# Patient Record
Sex: Female | Born: 1960 | Race: Black or African American | Hispanic: No | Marital: Married | Smoking: Former smoker
Health system: Southern US, Community
[De-identification: ages and names within clinical notes are randomized; demographics above are authoritative.]

## PROBLEM LIST (undated history)

## (undated) DIAGNOSIS — J449 Chronic obstructive pulmonary disease, unspecified: Secondary | ICD-10-CM

## (undated) DIAGNOSIS — T4145XA Adverse effect of unspecified anesthetic, initial encounter: Secondary | ICD-10-CM

## (undated) DIAGNOSIS — F419 Anxiety disorder, unspecified: Secondary | ICD-10-CM

## (undated) DIAGNOSIS — T8859XA Other complications of anesthesia, initial encounter: Secondary | ICD-10-CM

## (undated) DIAGNOSIS — I10 Essential (primary) hypertension: Secondary | ICD-10-CM

## (undated) DIAGNOSIS — R911 Solitary pulmonary nodule: Secondary | ICD-10-CM

## (undated) DIAGNOSIS — K219 Gastro-esophageal reflux disease without esophagitis: Secondary | ICD-10-CM

## (undated) DIAGNOSIS — F32A Depression, unspecified: Secondary | ICD-10-CM

## (undated) DIAGNOSIS — J45909 Unspecified asthma, uncomplicated: Secondary | ICD-10-CM

## (undated) DIAGNOSIS — R011 Cardiac murmur, unspecified: Secondary | ICD-10-CM

## (undated) DIAGNOSIS — F329 Major depressive disorder, single episode, unspecified: Secondary | ICD-10-CM

## (undated) HISTORY — PX: TUBAL LIGATION: SHX77

---

## 2006-08-05 ENCOUNTER — Inpatient Hospital Stay (HOSPITAL_COMMUNITY): Admission: AD | Admit: 2006-08-05 | Discharge: 2006-08-05 | Payer: Self-pay | Admitting: Obstetrics and Gynecology

## 2006-09-17 ENCOUNTER — Other Ambulatory Visit: Admission: RE | Admit: 2006-09-17 | Discharge: 2006-09-17 | Payer: Self-pay | Admitting: Obstetrics and Gynecology

## 2006-09-17 ENCOUNTER — Ambulatory Visit: Payer: Self-pay | Admitting: Obstetrics and Gynecology

## 2006-09-17 ENCOUNTER — Encounter: Payer: Self-pay | Admitting: Obstetrics and Gynecology

## 2006-09-17 ENCOUNTER — Encounter (INDEPENDENT_AMBULATORY_CARE_PROVIDER_SITE_OTHER): Payer: Self-pay | Admitting: Specialist

## 2006-10-28 ENCOUNTER — Ambulatory Visit (HOSPITAL_COMMUNITY): Admission: RE | Admit: 2006-10-28 | Discharge: 2006-10-28 | Payer: Self-pay | Admitting: Obstetrics and Gynecology

## 2009-07-17 ENCOUNTER — Emergency Department (HOSPITAL_BASED_OUTPATIENT_CLINIC_OR_DEPARTMENT_OTHER): Admission: EM | Admit: 2009-07-17 | Discharge: 2009-07-17 | Payer: Self-pay | Admitting: Emergency Medicine

## 2009-07-17 ENCOUNTER — Ambulatory Visit: Payer: Self-pay | Admitting: Radiology

## 2009-09-11 ENCOUNTER — Encounter: Admission: RE | Admit: 2009-09-11 | Discharge: 2009-09-11 | Payer: Self-pay | Admitting: Infectious Diseases

## 2009-10-11 ENCOUNTER — Ambulatory Visit: Payer: Self-pay | Admitting: Obstetrics and Gynecology

## 2009-10-11 ENCOUNTER — Other Ambulatory Visit: Payer: Self-pay | Admitting: Obstetrics and Gynecology

## 2009-12-13 ENCOUNTER — Ambulatory Visit: Payer: Self-pay | Admitting: Obstetrics and Gynecology

## 2009-12-28 ENCOUNTER — Ambulatory Visit: Payer: Self-pay | Admitting: Obstetrics and Gynecology

## 2010-03-21 ENCOUNTER — Ambulatory Visit: Payer: Self-pay | Admitting: Obstetrics and Gynecology

## 2010-06-07 ENCOUNTER — Ambulatory Visit: Payer: Self-pay | Admitting: Obstetrics & Gynecology

## 2010-08-27 ENCOUNTER — Ambulatory Visit: Payer: Self-pay | Admitting: Obstetrics & Gynecology

## 2010-08-30 ENCOUNTER — Encounter: Admission: RE | Admit: 2010-08-30 | Discharge: 2010-08-30 | Payer: Self-pay | Admitting: Emergency Medicine

## 2010-11-12 ENCOUNTER — Ambulatory Visit (INDEPENDENT_AMBULATORY_CARE_PROVIDER_SITE_OTHER): Payer: PRIVATE HEALTH INSURANCE

## 2010-11-12 DIAGNOSIS — N920 Excessive and frequent menstruation with regular cycle: Secondary | ICD-10-CM

## 2010-11-12 DIAGNOSIS — D259 Leiomyoma of uterus, unspecified: Secondary | ICD-10-CM

## 2011-01-03 LAB — URINE MICROSCOPIC-ADD ON

## 2011-01-03 LAB — URINALYSIS, ROUTINE W REFLEX MICROSCOPIC
Glucose, UA: NEGATIVE mg/dL
Protein, ur: NEGATIVE mg/dL
Specific Gravity, Urine: 1.016 (ref 1.005–1.030)
pH: 6 (ref 5.0–8.0)

## 2011-01-30 ENCOUNTER — Ambulatory Visit (INDEPENDENT_AMBULATORY_CARE_PROVIDER_SITE_OTHER): Payer: PRIVATE HEALTH INSURANCE

## 2011-01-30 DIAGNOSIS — N920 Excessive and frequent menstruation with regular cycle: Secondary | ICD-10-CM

## 2011-02-15 NOTE — Group Therapy Note (Signed)
Melissa Molina, Melissa Molina               ACCOUNT NO.:  [id number]   MEDICAL RECORD NO.:  [id number]          PATIENT TYPE:  WOC   LOCATION:  [facility]                   FACILITY:  [facility]   PHYSICIAN:  Argentina Donovan, MD        DATE OF BIRTH:  [birth year]   DATE OF SERVICE:  09/17/2006                                  CLINIC NOTE   The patient is a 50 year old gravida 2, para 2-0-0-2, 2 vaginal  deliveries who had a postpartum tubal ligation. Youngest child is 66  years old, has not seen a doctor 20 years with the exception of visit to  the [facility] because of dysfunctional uterine bleeding 1 month ago.  Her  hemoglobin was slightly low. She has not had any significant bleeding  since that time, has never had a mammogram, and has not had a Pap smear  and 20 years   EXAMINATION:  BREASTS:  Symmetrical with no dominant masses.  Slight  nipple discharge on the right.  ABDOMEN:  Soft, nontender.  No masses or organomegaly.  EXTERNAL GENITALIA:  Normal.  BUS is within normal limits.  Vagina is  clean and well rugated.  Cervix clean and parous.  Uterus anterior, of  normal size, shape, consistency.  Adnexa could not be well palpated.  Pap smear and endometrial biopsy were taken.  The uterus was sounded to  a depth of 7 cm. The patient is going to be referred for mammogram. Will  have the patient receive a phone call with the results of the  endometrial biopsy.  She has a positive whiff test is is being put on  metronidazole 500 b.i.d. for 7 days.   IMPRESSION:  Perimenopausal bleeding and bacterial vaginosis.  The  patient has been instructed if the endometrial biopsy is okay that it is  not necessary for her to return unless the heavy bleeding restarts. She  is going to be iron therapy, and if that does restart she is to come in  and we will talk about possibility of endometrial ablation.           ______________________________  Argentina Donovan, MD     PR/MEDQ  D:  09/17/2006  T:  09/17/2006  Job:   [id number]

## 2012-08-17 ENCOUNTER — Emergency Department (HOSPITAL_BASED_OUTPATIENT_CLINIC_OR_DEPARTMENT_OTHER)
Admission: EM | Admit: 2012-08-17 | Discharge: 2012-08-17 | Disposition: A | Discharge status: Home / Self Care | Payer: Worker's Compensation | Attending: Emergency Medicine | Admitting: Emergency Medicine

## 2012-08-17 ENCOUNTER — Encounter (HOSPITAL_BASED_OUTPATIENT_CLINIC_OR_DEPARTMENT_OTHER): Payer: Self-pay

## 2012-08-17 DIAGNOSIS — X19XXXA Contact with other heat and hot substances, initial encounter: Secondary | ICD-10-CM | POA: Insufficient documentation

## 2012-08-17 DIAGNOSIS — Y9269 Other specified industrial and construction area as the place of occurrence of the external cause: Secondary | ICD-10-CM | POA: Insufficient documentation

## 2012-08-17 DIAGNOSIS — T23209A Burn of second degree of unspecified hand, unspecified site, initial encounter: Secondary | ICD-10-CM | POA: Insufficient documentation

## 2012-08-17 DIAGNOSIS — Y990 Civilian activity done for income or pay: Secondary | ICD-10-CM | POA: Insufficient documentation

## 2012-08-17 DIAGNOSIS — Z8719 Personal history of other diseases of the digestive system: Secondary | ICD-10-CM | POA: Insufficient documentation

## 2012-08-17 DIAGNOSIS — F17200 Nicotine dependence, unspecified, uncomplicated: Secondary | ICD-10-CM | POA: Insufficient documentation

## 2012-08-17 DIAGNOSIS — Z23 Encounter for immunization: Secondary | ICD-10-CM | POA: Insufficient documentation

## 2012-08-17 DIAGNOSIS — T23002A Burn of unspecified degree of left hand, unspecified site, initial encounter: Secondary | ICD-10-CM

## 2012-08-17 DIAGNOSIS — I10 Essential (primary) hypertension: Secondary | ICD-10-CM | POA: Insufficient documentation

## 2012-08-17 HISTORY — DX: Essential (primary) hypertension: I10

## 2012-08-17 HISTORY — DX: Gastro-esophageal reflux disease without esophagitis: K21.9

## 2012-08-17 MED ORDER — TETANUS-DIPHTH-ACELL PERTUSSIS 5-2.5-18.5 LF-MCG/0.5 IM SUSP
0.5000 mL | Freq: Once | INTRAMUSCULAR | Status: AC
  Administered 2012-08-17: 0.5 mL via INTRAMUSCULAR
  Filled 2012-08-17: qty 0.5

## 2012-08-17 MED ORDER — BACITRACIN ZINC 500 UNIT/GM EX OINT
TOPICAL_OINTMENT | Freq: Two times a day (BID) | CUTANEOUS | Status: DC
  Filled 2012-08-17: qty 15

## 2012-08-17 MED ORDER — SILVER SULFADIAZINE 1 % EX CREA
TOPICAL_CREAM | Freq: Once | CUTANEOUS | Status: AC
  Administered 2012-08-17: 13:00:00 via TOPICAL

## 2012-08-17 MED ORDER — SILVER SULFADIAZINE 1 % EX CREA
TOPICAL_CREAM | CUTANEOUS | Status: AC
  Filled 2012-08-17: qty 85

## 2012-08-17 MED ORDER — OXYCODONE-ACETAMINOPHEN 5-325 MG PO TABS
2.0000 | ORAL_TABLET | Freq: Once | ORAL | Status: AC
  Administered 2012-08-17: 2 via ORAL
  Filled 2012-08-17 (×2): qty 2

## 2012-08-17 MED ORDER — OXYCODONE-ACETAMINOPHEN 5-325 MG PO TABS: 2.0000 | ORAL_TABLET | ORAL | Status: DC | PRN

## 2012-08-17 NOTE — ED Provider Notes (Signed)
History     CSN: [id number]  Arrival date & time 08/17/12  1036   First MD Initiated Contact with Patient 08/17/12 1143      Chief Complaint  Patient presents with  . 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 No other injury complains of pain at the index middle and ring fingers. No other injury. No treatment prior to coming here Past Medical History  Diagnosis Date  . Hypertension   . GERD (gastroesophageal reflux disease)     History reviewed. No pertinent past surgical history.  No family history on file.  History  Substance Use Topics  . Smoking status: Current Every Day Smoker -- 1.5 packs/day  . Smokeless tobacco: Not on file  . Alcohol Use: No   marijuana use  OB History    Grav Para Term Preterm Abortions TAB SAB Ect Mult Living                  Review of Systems  Constitutional: Negative.   Skin: Positive for wound.  All other systems reviewed and are negative.    Allergies  Review of patient's allergies indicates no known allergies.  Home Medications   Current Outpatient Rx  Name  Route  Sig  Dispense  Refill  . ESOMEPRAZOLE MAGNESIUM 40 MG PO CPDR   Oral   Take 40 mg by mouth daily before breakfast.           BP 170/87  Pulse 87  Temp 98.7 F (37.1 C) (Oral)  Resp 18  Ht 5\' 6"  (1.676 m)  Wt 164 lb (74.39 kg)  BMI 26.47 kg/m2  SpO2 99%  Physical Exam  Constitutional: She is oriented to person, place, and time. She appears well-developed and well-nourished.  HENT:  Head: Normocephalic.  Eyes: EOM are normal.  Neck: Neck supple.  Cardiovascular: Normal rate.   Pulmonary/Chest: Effort normal.  Abdominal: She exhibits no distension.  Neurological: She is oriented to person, place, and time.  Skin:       Left upper extremity with minimally tender at  hip of the index finger no blister. Middle finger blistered at [facility] surface , distal and middle phalanges, ring finger blistered at distal phalanx. Limited flexion of middle finger due to pain    ED Course  Procedures (including critical care time)  Labs Reviewed - No data to display No results found.   No diagnosis found.    MDM  Spoke with Dr. Dawna Part at [facility], [facility]. Plan Silvadene dressings changed twice daily, prescription Percocet 5 Follow up in burnclinic 08/21/2012. Blood pressure recheck within 3 weeks Diagnoses #1 partial thickness burn to left hand #2 hypertension        Doug Sou, MD 08/17/12 1237

## 2012-08-17 NOTE — ED Notes (Signed)
Burn/Blisters to fingers on left hand.

## 2014-05-30 DIAGNOSIS — N200 Calculus of kidney: Secondary | ICD-10-CM | POA: Insufficient documentation

## 2015-12-21 ENCOUNTER — Encounter (HOSPITAL_BASED_OUTPATIENT_CLINIC_OR_DEPARTMENT_OTHER): Payer: Self-pay | Admitting: *Deleted

## 2015-12-21 ENCOUNTER — Emergency Department (HOSPITAL_BASED_OUTPATIENT_CLINIC_OR_DEPARTMENT_OTHER)
Admission: EM | Admit: 2015-12-21 | Discharge: 2015-12-21 | Disposition: A | Discharge status: Home / Self Care | Payer: BLUE CROSS/BLUE SHIELD | Attending: Emergency Medicine | Admitting: Emergency Medicine

## 2015-12-21 DIAGNOSIS — I10 Essential (primary) hypertension: Secondary | ICD-10-CM | POA: Diagnosis not present

## 2015-12-21 DIAGNOSIS — R6883 Chills (without fever): Secondary | ICD-10-CM | POA: Diagnosis not present

## 2015-12-21 DIAGNOSIS — F17200 Nicotine dependence, unspecified, uncomplicated: Secondary | ICD-10-CM | POA: Diagnosis not present

## 2015-12-21 DIAGNOSIS — T50905A Adverse effect of unspecified drugs, medicaments and biological substances, initial encounter: Secondary | ICD-10-CM

## 2015-12-21 DIAGNOSIS — T363X5A Adverse effect of macrolides, initial encounter: Secondary | ICD-10-CM | POA: Diagnosis not present

## 2015-12-21 DIAGNOSIS — T43225A Adverse effect of selective serotonin reuptake inhibitors, initial encounter: Secondary | ICD-10-CM | POA: Insufficient documentation

## 2015-12-21 DIAGNOSIS — Z9851 Tubal ligation status: Secondary | ICD-10-CM | POA: Insufficient documentation

## 2015-12-21 DIAGNOSIS — Z79899 Other long term (current) drug therapy: Secondary | ICD-10-CM | POA: Diagnosis not present

## 2015-12-21 DIAGNOSIS — K219 Gastro-esophageal reflux disease without esophagitis: Secondary | ICD-10-CM | POA: Diagnosis not present

## 2015-12-21 DIAGNOSIS — F419 Anxiety disorder, unspecified: Secondary | ICD-10-CM | POA: Insufficient documentation

## 2015-12-21 DIAGNOSIS — R109 Unspecified abdominal pain: Secondary | ICD-10-CM | POA: Insufficient documentation

## 2015-12-21 DIAGNOSIS — M79673 Pain in unspecified foot: Secondary | ICD-10-CM | POA: Insufficient documentation

## 2015-12-21 HISTORY — DX: Anxiety disorder, unspecified: F41.9

## 2015-12-21 NOTE — Discharge Instructions (Signed)
°  At this time, your symptoms are suspected to be due to your new medication, Celexa. When starting this medication, people may experience a feeling of jitteriness and anxiety. Contact your family doctor tomorrow to discuss the new symptoms he had developed since starting the medication. Listed below our general signs and symptoms to watch for in case your sensation of chills are the early symptom of an infection.  Precautions for Fever/chills, Adult A fever is an increase in the body's temperature. It is usually defined as a temperature of 100F (38C) or higher. Brief mild or moderate fevers generally have no long-term effects, and they often do not require treatment. Moderate or high fevers may make you feel uncomfortable and can sometimes be a sign of a serious illness or disease. The sweating that may occur with repeated or prolonged fever may also cause dehydration. Fever is confirmed by taking a temperature with a thermometer. A measured temperature can vary with:  Age.  Time of day.  Location of the thermometer:  Mouth (oral).  Rectum (rectal).  Ear (tympanic).  Underarm (axillary).  Forehead (temporal). HOME CARE INSTRUCTIONS Pay attention to any changes in your symptoms. Take these actions to help with your condition:  Take over-the counter and prescription medicines only as told by your health care provider. Follow the dosing instructions carefully.  If you were prescribed an antibiotic medicine, take it as told by your health care provider. Do not stop taking the antibiotic even if you start to feel better.  Rest as needed.  Drink enough fluid to keep your urine clear or pale yellow. This helps to prevent dehydration.  Sponge yourself or bathe with room-temperature water to help reduce your body temperature as needed. Do not use ice water.  Do not overbundle yourself in blankets or heavy clothes. SEEK MEDICAL CARE IF:  You vomit.  You cannot eat or drink without  vomiting.  You have diarrhea.  You have pain when you urinate.  Your symptoms do not improve with treatment.  You develop new symptoms.  You develop excessive weakness. SEEK IMMEDIATE MEDICAL CARE IF:  You have shortness of breath or have trouble breathing.  You are dizzy or you faint.  You are disoriented or confused.  You develop signs of dehydration, such as a dry mouth, decreased urination, or paleness.  You develop severe pain in your abdomen.  You have persistent vomiting or diarrhea.  You develop a skin rash.  Your symptoms suddenly get worse.   This information is not intended to replace advice given to you by your health care provider. Make sure you discuss any questions you have with your health care provider.   Document Released: 03/12/2001 Document Revised: 06/07/2015 Document Reviewed: 11/10/2014 Elsevier Interactive Patient Education [employer].

## 2015-12-21 NOTE — ED Provider Notes (Signed)
CSN: [id number]     Arrival date & time 12/21/15  1902 History  By signing my name below, I, Altamease Oiler, attest that this documentation has been prepared under the direction and in the presence of Charlesetta Shanks, MD. Electronically Signed: Altamease Oiler, ED Scribe. 12/21/2015. 9:58 PM    Chief Complaint  Patient presents with  . Chills   The history is provided by the patient. No language interpreter was used.   Melissa Molina is a 55 y.o. female with history of HTN and anxiety who presents to the Emergency Department complaining of chills with onset this afternoon. Pt states that she was in her usual state of health this morning and the chills started after she took Celexa and clarithromycin (for recurrent sores at the bottom of her feet), both of which are new for her.  Associated symptoms include "jittery" abdominal discomfort. Pt also complains of a productive cough.  Pt denies fever and vomiting. She reports she just doesn't feel like herself. 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 She reports her doctor started the clarithromycin because she thought the were infected.  Past Medical History  Diagnosis Date  . Hypertension   . GERD (gastroesophageal reflux disease)   . Anxiety    Past Surgical History  Procedure Laterality Date  . Tubal ligation     No family history on file. Social History  Substance Use Topics  . Smoking status: Current Every Day Smoker -- 1.50 packs/day  . Smokeless tobacco: None  . Alcohol Use: No   OB History    No data available     Review of Systems  Constitutional: Positive for chills.  Respiratory: Positive for cough.   Gastrointestinal:       Abdominal discomfort   Skin:       Sores at the bottom of the feet  All other systems reviewed and are negative.  Allergies  Review of patient's allergies indicates no known allergies.  Home Medications   Prior to Admission  medications   Medication Sig Start Date End Date Taking? Authorizing Provider  Citalopram Hydrobromide (CELEXA PO) Take by mouth.   Yes Historical Provider, MD  Clarithromycin (BIAXIN PO) Take by mouth.   Yes Historical Provider, MD  HYDROCHLOROTHIAZIDE PO Take by mouth.   Yes Historical Provider, MD  esomeprazole (NEXIUM) 40 MG capsule Take 40 mg by mouth daily before breakfast.    Historical Provider, MD  oxyCODONE-acetaminophen (PERCOCET/ROXICET) 5-325 MG per tablet Take 2 tablets by mouth every 4 (four) hours as needed for pain. 08/17/12   Orlie Dakin, MD   BP 184/114 mmHg  Pulse 88  Temp(Src) 98.4 F (36.9 C) (Oral)  Resp 20  Ht 5' 5.5" (1.664 m)  Wt 164 lb (74.39 kg)  BMI 26.87 kg/m2  SpO2 100% Physical Exam  Constitutional: She is oriented to person, place, and time. She appears well-developed and well-nourished. No distress.  HENT:  Head: Normocephalic and atraumatic.  Nose: Nose normal.  Mouth/Throat: Oropharynx is clear and moist.  Eyes: EOM are normal. Pupils are equal, round, and reactive to light.  Neck: Normal range of motion. Neck supple.  Cardiovascular: Normal rate and regular rhythm.   2/6 systolic ejection murmur.  Pulmonary/Chest: Effort normal and breath sounds normal. No respiratory distress. She has no wheezes. She has no rales.  Abdominal: Soft. She exhibits no distension. There is no tenderness.  Musculoskeletal: Normal range of motion. She exhibits no  edema or tenderness.  Neurological: She is alert and oriented to person, place, and time. No cranial nerve deficit. She exhibits normal muscle tone. Coordination normal.  Patient is up and ambulatory about the emergency department. Her gait is coordinated.  Skin: Skin is warm and dry.  The soles of the patient's feet have multiple plantar warts. These are flat and keritinous. she reports they're tender however she is able to stand and ambulate about the room without difficulty. There is no general edema of  the feet there is no erythema to suggest cellulitis.  Psychiatric: She has a normal mood and affect. Judgment normal.  Nursing note and vitals reviewed.   ED Course  Procedures (including critical care time) DIAGNOSTIC STUDIES: Oxygen Saturation is 100% on RA,  normal by my interpretation.    COORDINATION OF CARE: 9:47 PM Discussed treatment plan with pt at bedside and pt agreed to plan.  Labs Review Labs Reviewed - No data to display  Imaging Review No results found.   EKG Interpretation None      MDM   Final diagnoses:  Chills (without fever)  Adverse effects of medication, initial encounter   Patient presents with sensation of chills. At this time she does not appear acutely ill. He does not have fever. Along with the symptom of chills, the patient has a general sensation of jitteriness. I suspect symptoms are secondary to her recent initiation of Celexa. At this time, we reviewed the typical side effect profile of the initial phase of taking Celexa. The patient is contact her physician tomorrow to discuss the merits of continuing this medication until those symptoms abate versus trying a different medication if needed for anxiety or depression. Patient is counseled on signs and symptoms of early infection. Fever instructions are provided although at this time there is no fever or immediate sign of infectious illness.   Charlesetta Shanks, MD 12/21/15 2209

## 2015-12-21 NOTE — ED Notes (Signed)
Chills, fever, diarrhea and vomiting this am. She was seen by her MD 2 days ago and given Rx for anxiety. She started taking the medication this am.

## 2016-02-07 DIAGNOSIS — K219 Gastro-esophageal reflux disease without esophagitis: Secondary | ICD-10-CM | POA: Insufficient documentation

## 2016-02-07 DIAGNOSIS — I10 Essential (primary) hypertension: Secondary | ICD-10-CM | POA: Insufficient documentation

## 2016-02-29 DIAGNOSIS — L84 Corns and callosities: Secondary | ICD-10-CM | POA: Insufficient documentation

## 2016-10-07 ENCOUNTER — Emergency Department (HOSPITAL_BASED_OUTPATIENT_CLINIC_OR_DEPARTMENT_OTHER)
Admission: EM | Admit: 2016-10-07 | Discharge: 2016-10-07 | Disposition: A | Discharge status: Home / Self Care | Payer: BLUE CROSS/BLUE SHIELD | Attending: Emergency Medicine | Admitting: Emergency Medicine

## 2016-10-07 ENCOUNTER — Emergency Department (HOSPITAL_BASED_OUTPATIENT_CLINIC_OR_DEPARTMENT_OTHER): Payer: BLUE CROSS/BLUE SHIELD

## 2016-10-07 ENCOUNTER — Encounter (HOSPITAL_BASED_OUTPATIENT_CLINIC_OR_DEPARTMENT_OTHER): Payer: Self-pay | Admitting: *Deleted

## 2016-10-07 ENCOUNTER — Telehealth (HOSPITAL_BASED_OUTPATIENT_CLINIC_OR_DEPARTMENT_OTHER): Payer: Self-pay | Admitting: *Deleted

## 2016-10-07 DIAGNOSIS — F17210 Nicotine dependence, cigarettes, uncomplicated: Secondary | ICD-10-CM | POA: Diagnosis not present

## 2016-10-07 DIAGNOSIS — R630 Anorexia: Secondary | ICD-10-CM | POA: Insufficient documentation

## 2016-10-07 DIAGNOSIS — R0981 Nasal congestion: Secondary | ICD-10-CM | POA: Diagnosis not present

## 2016-10-07 DIAGNOSIS — R69 Illness, unspecified: Secondary | ICD-10-CM

## 2016-10-07 DIAGNOSIS — I10 Essential (primary) hypertension: Secondary | ICD-10-CM | POA: Insufficient documentation

## 2016-10-07 DIAGNOSIS — R5383 Other fatigue: Secondary | ICD-10-CM | POA: Insufficient documentation

## 2016-10-07 DIAGNOSIS — R51 Headache: Secondary | ICD-10-CM | POA: Insufficient documentation

## 2016-10-07 DIAGNOSIS — R509 Fever, unspecified: Secondary | ICD-10-CM | POA: Diagnosis not present

## 2016-10-07 DIAGNOSIS — R05 Cough: Secondary | ICD-10-CM | POA: Diagnosis not present

## 2016-10-07 DIAGNOSIS — J111 Influenza due to unidentified influenza virus with other respiratory manifestations: Secondary | ICD-10-CM

## 2016-10-07 LAB — INFLUENZA PANEL BY PCR (TYPE A & B)
Influenza A By PCR: POSITIVE — AB
Influenza B By PCR: NEGATIVE

## 2016-10-07 MED ORDER — OSELTAMIVIR PHOSPHATE 75 MG PO CAPS: 75.0000 mg | ORAL_CAPSULE | Freq: Two times a day (BID) | ORAL | 0 refills | Status: DC

## 2016-10-07 MED ORDER — ONDANSETRON 8 MG PO TBDP: 8.0000 mg | ORAL_TABLET | Freq: Three times a day (TID) | ORAL | 0 refills | Status: DC | PRN

## 2016-10-07 MED ORDER — ONDANSETRON 4 MG PO TBDP
4.0000 mg | ORAL_TABLET | Freq: Once | ORAL | Status: AC
  Administered 2016-10-07: 4 mg via ORAL
  Filled 2016-10-07: qty 1

## 2016-10-07 MED FILL — ONDANSETRON ODT 8 MG TABLET: 8 | 3 days supply | Qty: 8 | Fill #0

## 2016-10-07 NOTE — ED Provider Notes (Signed)
[place] [facility] Provider Note   CSN: [id number] Arrival date & time: 10/07/16  1043     History   Chief Complaint Chief Complaint  Patient presents with  . Cough    HPI Melissa Molina is a 56 y.o. female.  HPI Patient presents with cough chills and feeling bad all over. Began last night. Has had a cough with some clear sputum production. Did not check his temperature but feels that she has had fevers. No clear sick contacts. No nausea vomiting.some slight dizziness. Has a dull headache also. No hemoptysis. No dysuria. No diarrhea.no sore throat   Past Medical History:  Diagnosis Date  . Anxiety   . GERD (gastroesophageal reflux disease)   . Hypertension     There are no active problems to display for this patient.   Past Surgical History:  Procedure Laterality Date  . TUBAL LIGATION      OB History    No data available       Home Medications    Prior to Admission medications   Medication Sig Start Date End Date Taking? Authorizing Provider  ondansetron (ZOFRAN-ODT) 8 MG disintegrating tablet Take 1 tablet (8 mg total) by mouth every 8 (eight) hours as needed for nausea or vomiting. 10/07/16   Davonna Belling, MD  oseltamivir (TAMIFLU) 75 MG capsule Take 1 capsule (75 mg total) by mouth every 12 (twelve) hours. 10/07/16   Davonna Belling, MD    Family History No family history on file.  Social History Social History  Substance Use Topics  . Smoking status: Current Every Day Smoker    Packs/day: 0.50    Types: Cigarettes  . Smokeless tobacco: Never Used  . Alcohol use No     Allergies   Patient has no known allergies.   Review of Systems Review of Systems  Constitutional: Positive for appetite change, fatigue and fever.  HENT: Positive for congestion. Negative for sore throat.   Eyes: Negative for photophobia.  Respiratory: Positive for cough.   Cardiovascular: Negative for chest pain.  Gastrointestinal: Negative for abdominal  distention.  Endocrine: Negative for polyuria.  Genitourinary: Negative for dyspareunia and dysuria.  Musculoskeletal: Positive for myalgias.  Skin: Negative for rash.  Neurological: Positive for headaches.  Hematological: Negative for adenopathy.  Psychiatric/Behavioral: Negative for confusion.     Physical Exam Updated Vital Signs BP 152/84 (BP Location: Left Arm)   Pulse 90   Temp 100.3 F (37.9 C) (Oral)   Resp 16   Ht 5\' 6"  (1.676 m)   Wt 158 lb (71.7 kg)   SpO2 97%   BMI 25.50 kg/m   Physical Exam  Constitutional: She appears well-developed.  HENT:  Head: Atraumatic.  Eyes: EOM are normal.  Neck: Neck supple.  Cardiovascular: Normal rate.   Pulmonary/Chest: Effort normal. No respiratory distress. She has no wheezes.  Abdominal: Soft.  Musculoskeletal: She exhibits no edema.  Neurological: She is alert.  Skin: Skin is warm. Capillary refill takes less than 2 seconds.     ED Treatments / Results  Labs (all labs ordered are listed, but only abnormal results are displayed) Labs Reviewed  INFLUENZA PANEL BY PCR (TYPE A & B, H1N1)    EKG  EKG Interpretation None       Radiology Dg Chest 2 View  Result Date: 10/07/2016 CLINICAL DATA:  Cough with clear sputum, chills, body aches, dizziness, symptoms began last evening, history GERD, hypertension, smoking EXAM: CHEST  2 VIEW COMPARISON:  12/19/2015 FINDINGS: Normal heart size,  mediastinal contours, and pulmonary vascularity. Suspected BILATERAL nipple shadows. Lungs clear. No pleural effusion or pneumothorax. Question new nodular density at the RIGHT apex 12 mm diameter. Small bone island lateral LEFT second rib unchanged. Bones otherwise unremarkable. IMPRESSION: No acute infiltrate. Question new 12 mm diameter nodular density at RIGHT apex; noncontrast CT chest recommended for further assessment. Electronically Signed   By: Lavonia Dana M.D.   On: 10/07/2016 11:38    Procedures Procedures (including critical  care time)  Medications Ordered in ED Medications  ondansetron (ZOFRAN-ODT) disintegrating tablet 4 mg (4 mg Oral Given 10/07/16 1407)     Initial Impression / Assessment and Plan / ED Course  I have reviewed the triage vital signs and the nursing notes.  Pertinent labs & imaging results that were available during my care of the patient were reviewed by me and considered in my medical decision making (see chart for details).  Clinical Course     Patient presents with flulike illness. Sputum chills aches. X-ray does not show pneumonia. Flu test pending but given Zofran and Tamiflu for home. Will follow-up with her own flu test results. There was a nodule on her chest x-ray to me to be followed. I initiated on form the patient this but nursing will follow-up with.  Final Clinical Impressions(s) / ED Diagnoses   Final diagnoses:  Influenza-like illness    New Prescriptions Discharge Medication List as of 10/07/2016  2:29 PM    START taking these medications   Details  ondansetron (ZOFRAN-ODT) 8 MG disintegrating tablet Take 1 tablet (8 mg total) by mouth every 8 (eight) hours as needed for nausea or vomiting., Starting Mon 10/07/2016, Print    oseltamivir (TAMIFLU) 75 MG capsule Take 1 capsule (75 mg total) by mouth every 12 (twelve) hours., Starting Mon 10/07/2016, Print         Davonna Belling, MD 10/07/16 [phone]

## 2016-10-07 NOTE — ED Triage Notes (Signed)
C/o cough with clear sputum, chills, body aches, dizziness. Onset last pm

## 2016-10-07 NOTE — Telephone Encounter (Signed)
Called pt per request of Dr. Alvino Chapel to advise her chest xray shows a lung nodule that needs CT follow-up as outpatient. Advised to call her PCP in the morning to discuss follow-up. Pt verbalized understanding.

## 2016-11-07 ENCOUNTER — Emergency Department (HOSPITAL_BASED_OUTPATIENT_CLINIC_OR_DEPARTMENT_OTHER): Payer: BLUE CROSS/BLUE SHIELD

## 2016-11-07 ENCOUNTER — Encounter (HOSPITAL_BASED_OUTPATIENT_CLINIC_OR_DEPARTMENT_OTHER): Payer: Self-pay | Admitting: *Deleted

## 2016-11-07 ENCOUNTER — Emergency Department (HOSPITAL_BASED_OUTPATIENT_CLINIC_OR_DEPARTMENT_OTHER)
Admission: EM | Admit: 2016-11-07 | Discharge: 2016-11-07 | Disposition: A | Discharge status: Home / Self Care | Payer: BLUE CROSS/BLUE SHIELD | Attending: Emergency Medicine | Admitting: Emergency Medicine

## 2016-11-07 DIAGNOSIS — J449 Chronic obstructive pulmonary disease, unspecified: Secondary | ICD-10-CM | POA: Diagnosis not present

## 2016-11-07 DIAGNOSIS — M546 Pain in thoracic spine: Secondary | ICD-10-CM | POA: Diagnosis present

## 2016-11-07 DIAGNOSIS — M62830 Muscle spasm of back: Secondary | ICD-10-CM

## 2016-11-07 DIAGNOSIS — M5489 Other dorsalgia: Secondary | ICD-10-CM

## 2016-11-07 DIAGNOSIS — J45909 Unspecified asthma, uncomplicated: Secondary | ICD-10-CM | POA: Diagnosis not present

## 2016-11-07 DIAGNOSIS — F17210 Nicotine dependence, cigarettes, uncomplicated: Secondary | ICD-10-CM | POA: Diagnosis not present

## 2016-11-07 DIAGNOSIS — R911 Solitary pulmonary nodule: Secondary | ICD-10-CM

## 2016-11-07 DIAGNOSIS — I10 Essential (primary) hypertension: Secondary | ICD-10-CM | POA: Insufficient documentation

## 2016-11-07 HISTORY — DX: Unspecified asthma, uncomplicated: J45.909

## 2016-11-07 HISTORY — DX: Chronic obstructive pulmonary disease, unspecified: J44.9

## 2016-11-07 LAB — CBC WITH DIFFERENTIAL/PLATELET
BASOS PCT: 0 %
Basophils Absolute: 0 10*3/uL (ref 0.0–0.1)
Eosinophils Absolute: 0.1 10*3/uL (ref 0.0–0.7)
Eosinophils Relative: 1 %
HEMATOCRIT: 39 % (ref 36.0–46.0)
Hemoglobin: 13.2 g/dL (ref 12.0–15.0)
LYMPHS PCT: 25 %
Lymphs Abs: 2.7 10*3/uL (ref 0.7–4.0)
MCH: 31 pg (ref 26.0–34.0)
MCHC: 33.8 g/dL (ref 30.0–36.0)
MCV: 91.5 fL (ref 78.0–100.0)
MONO ABS: 1.1 10*3/uL — AB (ref 0.1–1.0)
MONOS PCT: 10 %
NEUTROS ABS: 6.8 10*3/uL (ref 1.7–7.7)
Neutrophils Relative %: 64 %
Platelets: 393 10*3/uL (ref 150–400)
RBC: 4.26 MIL/uL (ref 3.87–5.11)
RDW: 13.3 % (ref 11.5–15.5)
WBC: 10.7 10*3/uL — ABNORMAL HIGH (ref 4.0–10.5)

## 2016-11-07 LAB — TROPONIN I: Troponin I: <0.03 ng/mL (ref <0.03)

## 2016-11-07 LAB — COMPREHENSIVE METABOLIC PANEL
ALT: 16 U/L (ref 14–54)
ANION GAP: 10 (ref 5–15)
AST: 28 U/L (ref 15–41)
Albumin: 4 g/dL (ref 3.5–5.0)
Alkaline Phosphatase: 104 U/L (ref 38–126)
BILIRUBIN TOTAL: 0.6 mg/dL (ref 0.3–1.2)
BUN: 39 mg/dL — ABNORMAL HIGH (ref 6–20)
CO2: 23 mmol/L (ref 22–32)
Calcium: 9.4 mg/dL (ref 8.9–10.3)
Chloride: 101 mmol/L (ref 101–111)
Creatinine, Ser: 1.43 mg/dL — ABNORMAL HIGH (ref 0.44–1.00)
GFR calc Af Amer: 46 mL/min — ABNORMAL LOW (ref >60)
GFR, EST NON AFRICAN AMERICAN: 40 mL/min — AB (ref >60)
Glucose, Bld: 98 mg/dL (ref 65–99)
POTASSIUM: 3.7 mmol/L (ref 3.5–5.1)
Sodium: 134 mmol/L — ABNORMAL LOW (ref 135–145)
TOTAL PROTEIN: 8 g/dL (ref 6.5–8.1)

## 2016-11-07 LAB — URINALYSIS, ROUTINE W REFLEX MICROSCOPIC
GLUCOSE, UA: NEGATIVE mg/dL
KETONES UR: NEGATIVE mg/dL
LEUKOCYTES UA: NEGATIVE
Nitrite: NEGATIVE
PH: 6 (ref 5.0–8.0)
Protein, ur: 100 mg/dL — AB
Specific Gravity, Urine: 1.023 (ref 1.005–1.030)

## 2016-11-07 LAB — URINALYSIS, MICROSCOPIC (REFLEX)

## 2016-11-07 LAB — LIPASE, BLOOD: LIPASE: 22 U/L (ref 11–51)

## 2016-11-07 MED ORDER — CYCLOBENZAPRINE HCL 10 MG PO TABS: 10.0000 mg | ORAL_TABLET | Freq: Two times a day (BID) | ORAL | 0 refills | Status: DC | PRN

## 2016-11-07 MED ORDER — IOPAMIDOL (ISOVUE-300) INJECTION 61%
100.0000 mL | Freq: Once | INTRAVENOUS | Status: AC | PRN
  Administered 2016-11-07: 100 mL via INTRAVENOUS

## 2016-11-07 MED ORDER — MORPHINE SULFATE (PF) 4 MG/ML IV SOLN
4.0000 mg | Freq: Once | INTRAVENOUS | Status: AC
  Administered 2016-11-07: 4 mg via INTRAVENOUS
  Filled 2016-11-07: qty 1

## 2016-11-07 MED ORDER — IPRATROPIUM-ALBUTEROL 0.5-2.5 (3) MG/3ML IN SOLN
3.0000 mL | Freq: Four times a day (QID) | RESPIRATORY_TRACT | Status: DC
  Administered 2016-11-07: 3 mL via RESPIRATORY_TRACT
  Filled 2016-11-07: qty 3

## 2016-11-07 MED ORDER — IBUPROFEN 400 MG PO TABS
400.0000 mg | ORAL_TABLET | Freq: Once | ORAL | Status: AC | PRN
  Administered 2016-11-07: 400 mg via ORAL
  Filled 2016-11-07: qty 1

## 2016-11-07 MED ORDER — ONDANSETRON HCL 4 MG/2ML IJ SOLN
4.0000 mg | Freq: Once | INTRAMUSCULAR | Status: AC
  Administered 2016-11-07: 4 mg via INTRAVENOUS
  Filled 2016-11-07: qty 2

## 2016-11-07 MED FILL — CYCLOBENZAPRINE 10 MG TAB: 10 | 10 days supply | Qty: 20 | Fill #0

## 2016-11-07 NOTE — ED Triage Notes (Signed)
Pain in her left upper back x 2 days. Pain is worse when she takes a breath. She took Tylenol 650mg   30 minutes ago.

## 2016-11-07 NOTE — ED Provider Notes (Signed)
[facility] Provider Note   CSN: [id number] Arrival date & time: 11/07/16  1100     History   Chief Complaint Chief Complaint  Patient presents with  . Back Pain    HPI Melissa Molina is a 56 y.o. female.  Patient presents emergency department with chief complaint of upper back pain 2 days. She states the pain worsened today. She reports that is worse when she takes deep breath. She states she commonly left heavy objects at work. She denies any productive cough, or fever. Denies any abdominal pain, nausea, or vomiting. She denies any numbness, weakness, or tingling. Denies any other associated symptoms.   The history is provided by the patient. No language interpreter was used.    Past Medical History:  Diagnosis Date  . Anxiety   . Asthma   . COPD (chronic obstructive pulmonary disease) ([place])   . GERD (gastroesophageal reflux disease)   . Hypertension     There are no active problems to display for this patient.   Past Surgical History:  Procedure Laterality Date  . TUBAL LIGATION      OB History    No data available       Home Medications    Prior to Admission medications   Medication Sig Start Date End Date Taking? Authorizing Provider  ondansetron (ZOFRAN-ODT) 8 MG disintegrating tablet Take 1 tablet (8 mg total) by mouth every 8 (eight) hours as needed for nausea or vomiting. 10/07/16   Davonna Belling, MD  oseltamivir (TAMIFLU) 75 MG capsule Take 1 capsule (75 mg total) by mouth every 12 (twelve) hours. 10/07/16   Davonna Belling, MD    Family History No family history on file.  Social History Social History  Substance Use Topics  . Smoking status: Current Every Day Smoker    Packs/day: 0.50    Types: Cigarettes  . Smokeless tobacco: Never Used  . Alcohol use No     Allergies   Patient has no known allergies.   Review of Systems Review of Systems  Musculoskeletal: Positive for back pain.  All other systems reviewed and are  negative.    Physical Exam Updated Vital Signs BP 121/88 (BP Location: Right Arm)   Pulse 94   Temp 98 F (36.7 C) (Oral)   Resp 20   Ht 5\' 6"  (1.676 m)   Wt 71.7 kg   SpO2 99%   BMI 25.50 kg/m   Physical Exam  Constitutional: She is oriented to person, place, and time. She appears well-developed and well-nourished.  HENT:  Head: Normocephalic and atraumatic.  Eyes: Conjunctivae and EOM are normal. Pupils are equal, round, and reactive to light.  Neck: Normal range of motion. Neck supple.  Cardiovascular: Normal rate and regular rhythm.  Exam reveals no gallop and no friction rub.   No murmur heard. Pulmonary/Chest: Effort normal. No respiratory distress. She has wheezes. She has no rales. She exhibits no tenderness.  Mild expiratory wheezes  Abdominal: Soft. Bowel sounds are normal. She exhibits no distension and no mass. There is no tenderness. There is no rebound and no guarding.  Musculoskeletal: Normal range of motion. She exhibits no edema or tenderness.  Mild muscular tenderness in the upper trapezius  Neurological: She is alert and oriented to person, place, and time.  Skin: Skin is warm and dry.  Psychiatric: She has a normal mood and affect. Her behavior is normal. Judgment and thought content normal.  Nursing note and vitals reviewed.    ED Treatments /  Results  Labs (all labs ordered are listed, but only abnormal results are displayed) Labs Reviewed  URINALYSIS, ROUTINE W REFLEX MICROSCOPIC - Abnormal; Notable for the following:       Result Value   APPearance CLOUDY (*)    Hgb urine dipstick LARGE (*)    Bilirubin Urine SMALL (*)    Protein, ur 100 (*)    All other components within normal limits  URINALYSIS, MICROSCOPIC (REFLEX) - Abnormal; Notable for the following:    Bacteria, UA FEW (*)    Squamous Epithelial / LPF 0-5 (*)    All other components within normal limits  CBC WITH DIFFERENTIAL/PLATELET  COMPREHENSIVE METABOLIC PANEL  TROPONIN I    LIPASE, BLOOD    EKG  EKG Interpretation  Date/Time:  Thursday November 07 2016 11:23:45 EST Ventricular Rate:  121 PR Interval:  144 QRS Duration: 78 QT Interval:  316 QTC Calculation: 448 R Axis:   76 Text Interpretation:  Sinus tachycardia Biatrial enlargement Possible Inferior infarct , age undetermined Anterolateral infarct , age undetermined Abnormal ECG No previous ECGs available Confirmed by ZACKOWSKI  MD, SCOTT [phone]) on 11/07/2016 11:55:32 AM       Radiology Dg Chest 2 View  Result Date: 11/07/2016 CLINICAL DATA:  Left chest pain. EXAM: CHEST  2 VIEW COMPARISON:  10/07/2016 FINDINGS: Nodular density again noted in the right apex. Small nodular densities in the left upper lobe, likely calcified granulomas. These nodular areas are stable since prior study. No confluent airspace opacity or effusion. Heart is normal size. IMPRESSION: Probable granulomas in the left upper lobe. Stable nonspecific nodular density at the right apex. This could be further evaluated with chest CT. Electronically Signed   By: Rolm Baptise M.D.   On: 11/07/2016 11:46   Ct Chest W Contrast  Result Date: 11/07/2016 CLINICAL DATA:  Upper back pain for 2 days, worse with inspiration. Follow-up presumed apical granulomas. EXAM: CT CHEST WITH CONTRAST TECHNIQUE: Multidetector CT imaging of the chest was performed during intravenous contrast administration. CONTRAST:  17mL ISOVUE-300 IOPAMIDOL (ISOVUE-300) INJECTION 61% COMPARISON:  Chest radiograph November 07, 2016 at 1128 hours FINDINGS: CARDIOVASCULAR: Heart and pericardium are unremarkable. Thoracic aorta is normal course and caliber, 2 vessel arch is a normal variant. Mild calcific atherosclerosis of the aortic arch. MEDIASTINUM/NODES: No mediastinal mass. No lymphadenopathy by CT size criteria. Normal appearance of thoracic esophagus though not tailored for evaluation. LUNGS/PLEURA: 12 mm and 5 mm RIGHT apical subpleural pulmonary nodules. Minimal apical  bullous changes. 3 mm ground-glass nodule RIGHT upper lobe series 3, 37/177. Mild centrilobular emphysema. Two RIGHT middle lobe 3 mm sub solid pulmonary nodules. Very faint tiny centrilobular pulmonary ground-glass nodules. No pleural effusion or focal consolidation. Trace bronchial wall thickening. No pneumothorax. UPPER ABDOMEN: Nonacute. 2.6 cm RIGHT upper pole benign-appearing cyst. Subcentimeter calcification at pancreatic tail may be within the pancreas or, less likely splenic artery aneurysm. MUSCULOSKELETAL: Included soft tissues and included osseous structures are nonsuspicious. Moderate bilateral acromioclavicular osteoarthrosis. Subcentimeter bone island LEFT anterior second rib corresponding to radiographic abnormality. IMPRESSION: Mild centrilobular emphysema. 12 mm and 5 mm RIGHT apical subpleural pulmonary nodules. Non-contrast chest CT at 3-6 months is recommended. If the nodules are stable at time of repeat CT, then future CT at 18-24 months (from today's scan) is considered optional for low-risk patients, but is recommended for high-risk patients. This recommendation follows the consensus statement: Guidelines for Management of Incidental Pulmonary Nodules Detected on CT Images: From the Fleischner Society 2017; Radiology 2017; 284:228-243. Trace  bronchial wall thickening and tiny ground-glass centrilobular nodules may be infectious or inflammatory. No focal consolidation. LEFT anterior second rib bone island corresponds to prior radiographic abnormality. Electronically Signed   By: Elon Alas M.D.   On: 11/07/2016 15:06    Procedures Procedures (including critical care time)  Medications Ordered in ED Medications  morphine 4 MG/ML injection 4 mg (not administered)  ondansetron (ZOFRAN) injection 4 mg (not administered)  ibuprofen (ADVIL,MOTRIN) tablet 400 mg (400 mg Oral Given 11/07/16 1118)     Initial Impression / Assessment and Plan / ED Course  I have reviewed the triage  vital signs and the nursing notes.  Pertinent labs & imaging results that were available during my care of the patient were reviewed by me and considered in my medical decision making (see chart for details).     Patient with back pain. Back pain thought to be more muscular related. She does lift heavy objects at work daily. She has some tenderness to palpation. Chest x-ray was ordered in triage, shows possible nodule. Will check CT for further evaluation. Patient has a mild amount of wheezing, as reviewed after breathing treatment.  Patient now feels much better. She is pain-free. Her CT is as above. Recommend repeat CT in 3-6 months for reassessment of the nodule. Recommend primary care follow-up. I discussed this plan with patient and have provided her with written instructions.  Final Clinical Impressions(s) / ED Diagnoses   Final diagnoses:  Other back pain, unspecified chronicity  Muscle spasm of back  Lung nodule    New Prescriptions New Prescriptions   CYCLOBENZAPRINE (FLEXERIL) 10 MG TABLET    Take 1 tablet (10 mg total) by mouth 2 (two) times daily as needed for muscle spasms.     Montine Circle, PA-C 11/07/16 [place], MD 11/08/16 1714

## 2016-11-07 NOTE — Discharge Instructions (Signed)
You chest scan showed pulmonary nodules.  You need to have a repeat scan in 3-6 months to reassess these nodules.  Please do not strain your back lifting.  You back pain today is thought to be related to muscle injury and overuse.

## 2016-11-07 NOTE — ED Notes (Signed)
Patient transported to CT 

## 2017-01-30 ENCOUNTER — Other Ambulatory Visit (HOSPITAL_BASED_OUTPATIENT_CLINIC_OR_DEPARTMENT_OTHER): Payer: Self-pay | Admitting: Internal Medicine

## 2017-01-30 DIAGNOSIS — R911 Solitary pulmonary nodule: Secondary | ICD-10-CM

## 2017-02-05 ENCOUNTER — Encounter (HOSPITAL_BASED_OUTPATIENT_CLINIC_OR_DEPARTMENT_OTHER): Payer: Self-pay

## 2017-02-05 ENCOUNTER — Ambulatory Visit (HOSPITAL_BASED_OUTPATIENT_CLINIC_OR_DEPARTMENT_OTHER)
Admission: RE | Admit: 2017-02-05 | Discharge: 2017-02-05 | Disposition: A | Discharge status: Home / Self Care | Payer: BLUE CROSS/BLUE SHIELD | Source: Ambulatory Visit | Attending: Internal Medicine | Admitting: Internal Medicine

## 2017-02-05 DIAGNOSIS — R918 Other nonspecific abnormal finding of lung field: Secondary | ICD-10-CM | POA: Diagnosis not present

## 2017-02-05 DIAGNOSIS — R911 Solitary pulmonary nodule: Secondary | ICD-10-CM

## 2017-02-05 MED ORDER — IOPAMIDOL (ISOVUE-300) INJECTION 61%
100.0000 mL | Freq: Once | INTRAVENOUS | Status: AC | PRN
  Administered 2017-02-05: 80 mL via INTRAVENOUS

## 2017-11-25 DIAGNOSIS — M722 Plantar fascial fibromatosis: Secondary | ICD-10-CM | POA: Insufficient documentation

## 2018-03-10 ENCOUNTER — Other Ambulatory Visit: Payer: Self-pay | Admitting: Oncology

## 2018-03-10 ENCOUNTER — Emergency Department (HOSPITAL_BASED_OUTPATIENT_CLINIC_OR_DEPARTMENT_OTHER): Payer: BLUE CROSS/BLUE SHIELD

## 2018-03-10 ENCOUNTER — Encounter (HOSPITAL_BASED_OUTPATIENT_CLINIC_OR_DEPARTMENT_OTHER): Payer: Self-pay | Admitting: Emergency Medicine

## 2018-03-10 ENCOUNTER — Other Ambulatory Visit: Payer: Self-pay

## 2018-03-10 ENCOUNTER — Emergency Department (HOSPITAL_BASED_OUTPATIENT_CLINIC_OR_DEPARTMENT_OTHER)
Admission: EM | Admit: 2018-03-10 | Discharge: 2018-03-10 | Disposition: A | Discharge status: Home / Self Care | Payer: BLUE CROSS/BLUE SHIELD | Attending: Emergency Medicine | Admitting: Emergency Medicine

## 2018-03-10 DIAGNOSIS — R911 Solitary pulmonary nodule: Secondary | ICD-10-CM | POA: Insufficient documentation

## 2018-03-10 DIAGNOSIS — Z79899 Other long term (current) drug therapy: Secondary | ICD-10-CM | POA: Diagnosis not present

## 2018-03-10 DIAGNOSIS — I10 Essential (primary) hypertension: Secondary | ICD-10-CM | POA: Diagnosis not present

## 2018-03-10 DIAGNOSIS — R918 Other nonspecific abnormal finding of lung field: Secondary | ICD-10-CM | POA: Insufficient documentation

## 2018-03-10 DIAGNOSIS — R6883 Chills (without fever): Secondary | ICD-10-CM | POA: Insufficient documentation

## 2018-03-10 DIAGNOSIS — F1210 Cannabis abuse, uncomplicated: Secondary | ICD-10-CM | POA: Insufficient documentation

## 2018-03-10 DIAGNOSIS — R1110 Vomiting, unspecified: Secondary | ICD-10-CM

## 2018-03-10 DIAGNOSIS — J449 Chronic obstructive pulmonary disease, unspecified: Secondary | ICD-10-CM | POA: Insufficient documentation

## 2018-03-10 DIAGNOSIS — F17210 Nicotine dependence, cigarettes, uncomplicated: Secondary | ICD-10-CM | POA: Insufficient documentation

## 2018-03-10 DIAGNOSIS — R197 Diarrhea, unspecified: Secondary | ICD-10-CM | POA: Insufficient documentation

## 2018-03-10 DIAGNOSIS — J45909 Unspecified asthma, uncomplicated: Secondary | ICD-10-CM | POA: Insufficient documentation

## 2018-03-10 HISTORY — DX: Depression, unspecified: F32.A

## 2018-03-10 HISTORY — DX: Major depressive disorder, single episode, unspecified: F32.9

## 2018-03-10 LAB — CBC
HCT: 40 % (ref 36.0–46.0)
Hemoglobin: 14.1 g/dL (ref 12.0–15.0)
MCH: 31.6 pg (ref 26.0–34.0)
MCHC: 35.3 g/dL (ref 30.0–36.0)
MCV: 89.7 fL (ref 78.0–100.0)
Platelets: 465 10*3/uL — ABNORMAL HIGH (ref 150–400)
RBC: 4.46 MIL/uL (ref 3.87–5.11)
RDW: 12.1 % (ref 11.5–15.5)
WBC: 11.2 10*3/uL — ABNORMAL HIGH (ref 4.0–10.5)

## 2018-03-10 LAB — COMPREHENSIVE METABOLIC PANEL
ALBUMIN: 4.2 g/dL (ref 3.5–5.0)
ALT: 15 U/L (ref 14–54)
ANION GAP: 10 (ref 5–15)
AST: 22 U/L (ref 15–41)
Alkaline Phosphatase: 95 U/L (ref 38–126)
BILIRUBIN TOTAL: 0.8 mg/dL (ref 0.3–1.2)
BUN: 11 mg/dL (ref 6–20)
CHLORIDE: 98 mmol/L — AB (ref 101–111)
CO2: 25 mmol/L (ref 22–32)
Calcium: 9 mg/dL (ref 8.9–10.3)
Creatinine, Ser: 0.77 mg/dL (ref 0.44–1.00)
GFR calc Af Amer: >60 mL/min (ref >60)
GLUCOSE: 106 mg/dL — AB (ref 65–99)
POTASSIUM: 3.5 mmol/L (ref 3.5–5.1)
Sodium: 133 mmol/L — ABNORMAL LOW (ref 135–145)
Total Protein: 8.1 g/dL (ref 6.5–8.1)

## 2018-03-10 LAB — URINALYSIS, ROUTINE W REFLEX MICROSCOPIC
BILIRUBIN URINE: NEGATIVE
GLUCOSE, UA: NEGATIVE mg/dL
KETONES UR: 15 mg/dL — AB
Leukocytes, UA: NEGATIVE
Nitrite: NEGATIVE
PH: 7.5 (ref 5.0–8.0)
Protein, ur: 100 mg/dL — AB
Specific Gravity, Urine: 1.025 (ref 1.005–1.030)

## 2018-03-10 LAB — URINALYSIS, MICROSCOPIC (REFLEX)

## 2018-03-10 LAB — LIPASE, BLOOD: LIPASE: 29 U/L (ref 11–51)

## 2018-03-10 LAB — TROPONIN I: Troponin I: <0.03 ng/mL (ref <0.03)

## 2018-03-10 MED ORDER — IOPAMIDOL (ISOVUE-300) INJECTION 61%
100.0000 mL | Freq: Once | INTRAVENOUS | Status: AC | PRN
  Administered 2018-03-10: 80 mL via INTRAVENOUS

## 2018-03-10 MED ORDER — ONDANSETRON HCL 4 MG/2ML IJ SOLN
INTRAMUSCULAR | Status: AC
  Administered 2018-03-10: 4 mg via INTRAVENOUS
  Filled 2018-03-10: qty 2

## 2018-03-10 MED ORDER — SODIUM CHLORIDE 0.9 % IV BOLUS
1000.0000 mL | Freq: Once | INTRAVENOUS | Status: AC
  Administered 2018-03-10: 1000 mL via INTRAVENOUS

## 2018-03-10 MED ORDER — ONDANSETRON 4 MG PO TBDP: 4.0000 mg | ORAL_TABLET | Freq: Three times a day (TID) | ORAL | 0 refills | Status: DC | PRN

## 2018-03-10 MED ORDER — ONDANSETRON HCL 4 MG/2ML IJ SOLN
4.0000 mg | Freq: Once | INTRAMUSCULAR | Status: AC | PRN
  Administered 2018-03-10: 4 mg via INTRAVENOUS

## 2018-03-10 MED ORDER — PROMETHAZINE HCL 25 MG PO TABS: 25.0000 mg | ORAL_TABLET | Freq: Four times a day (QID) | ORAL | 0 refills | Status: DC | PRN

## 2018-03-10 NOTE — ED Notes (Signed)
Patient transported to CT 

## 2018-03-10 NOTE — Progress Notes (Signed)
I was called by Dr. Verta Ellen from the [facility] emergency room regarding this 57 year old Melissa Molina woman who presented with vomiting and some back pain.  A chest x-ray was obtained which showed a 1.4 cm pulmonary nodule.  This was followed up by CT scan of the chest with contrast showing a 1.6 cm right upper lobe nodule.  There is a CT scan of the chest from 02/05/2017 which showed this nodule previously to measure 1.2 cm.  An adjacent smaller nodule measured 1.0 cm, previously 0.7.  There is centrilobular emphysema.  The scan also shows some degenerative disease in the spine.  We are proceeding to set the patient up for biopsy of the pulmonary nodule and depending on those results she may or may not require follow-up with our thoracic cancer group

## 2018-03-10 NOTE — ED Notes (Signed)
Pt began c/o pain between the shoulder blades and tightness in her chest. EDP made aware.

## 2018-03-10 NOTE — ED Notes (Signed)
Pt on monitor 

## 2018-03-10 NOTE — ED Triage Notes (Signed)
Pt c/o vomiting and chills since yesterday. States she started taking duloxetine and thinks that is making her sick.

## 2018-03-10 NOTE — ED Notes (Signed)
Pt given gingerale for PO challenge 

## 2018-03-10 NOTE — Discharge Instructions (Signed)
The lung nodules seen on your CT scan are concerning.  It is very important you get a biopsy to further evaluate these and rule out cancer.  You will be called to set up this appointment for the biopsy.  If you do not get a call in the next 24 hours or so, call your primary care doctor immediately.  If you develop worsening vomiting or if you develop chest pain, shortness of breath, abdominal pain, or inability to tolerate oral fluids then return to the ER as well.

## 2018-03-10 NOTE — ED Provider Notes (Signed)
[facility] EMERGENCY DEPARTMENT Provider Note   CSN: [id number] Arrival date & time: 03/10/18  0712     History   Chief Complaint Chief Complaint  Patient presents with  . Emesis    HPI Melissa Molina is a 57 y.o. female.  HPI  57 year old female presents with vomiting.  Vomiting started last night.  She is concerned this is related to her new antidepressant, Cymbalta.  She started this 3 days ago and it first had no issues but because of vomiting last night attributes it to the Cymbalta.  The vomiting started many hours after the Cymbalta.  She is had about 3 episodes of diarrhea.  No blood in either.  At this point she is only having dry heaving.  She denies fevers, chest pain, shortness of breath, abdominal pain.  She is having some chills.  Past Medical History:  Diagnosis Date  . Anxiety   . Asthma   . COPD (chronic obstructive pulmonary disease) ([place])   . Depression   . GERD (gastroesophageal reflux disease)   . Hypertension     Patient Active Problem List   Diagnosis Date Noted  . Lung nodule seen on imaging study 03/10/2018    Past Surgical History:  Procedure Laterality Date  . TUBAL LIGATION       OB History   None      Home Medications    Prior to Admission medications   Medication Sig Start Date End Date Taking? Authorizing Provider  DULoxetine (CYMBALTA) 60 MG capsule Take 60 mg by mouth daily.   Yes [provider]  gabapentin (NEURONTIN) 100 MG capsule Take 100 mg by mouth 3 (three) times daily.   Yes [provider]  hydrochlorothiazide (MICROZIDE) 12.5 MG capsule Take 12.5 mg by mouth daily.   Yes [provider]  lisinopril (PRINIVIL,ZESTRIL) 5 MG tablet Take 5 mg by mouth daily.   Yes [provider]  lovastatin (ALTOPREV) 20 MG 24 hr tablet Take 20 mg by mouth at bedtime.   Yes [provider]  meloxicam (MOBIC) 7.5 MG tablet Take 7.5 mg by mouth daily.   Yes [provider]   ranitidine (ZANTAC) 75 MG tablet Take 75 mg by mouth 2 (two) times daily.   Yes [provider]  cyclobenzaprine (FLEXERIL) 10 MG tablet Take 1 tablet (10 mg total) by mouth 2 (two) times daily as needed for muscle spasms. 11/07/16   Montine Circle, PA-C  ondansetron (ZOFRAN ODT) 4 MG disintegrating tablet Take 1 tablet (4 mg total) by mouth every 8 (eight) hours as needed. 03/10/18   [name], MD  promethazine (PHENERGAN) 25 MG tablet Take 1 tablet (25 mg total) by mouth every 6 (six) hours as needed for nausea or vomiting. 03/10/18   [name], MD    Family History No family history on file.  Social History Social History   Tobacco Use  . Smoking status: Current Every Day Smoker    Packs/day: 0.50    Types: Cigarettes  . Smokeless tobacco: Never Used  Substance Use Topics  . Alcohol use: No  . Drug use: Yes    Types: Marijuana     Allergies   Patient has no known allergies.   Review of Systems Review of Systems  Constitutional: Positive for chills. Negative for fever.  Respiratory: Negative for shortness of breath.   Cardiovascular: Negative for chest pain.  Gastrointestinal: Positive for abdominal pain, diarrhea, nausea and vomiting.  All other systems reviewed and are  negative.    Physical Exam Updated Vital Signs BP (!) 164/103 (BP Location: Right Arm)   Pulse 77   Temp 98 F (36.7 C) (Oral)   Resp 18   Ht 5\' 6"  (1.676 m)   Wt 78 kg (172 lb)   SpO2 99%   BMI 27.76 kg/m   Physical Exam  Constitutional: She is oriented to person, place, and time. She appears well-developed and well-nourished.  HENT:  Head: Normocephalic and atraumatic.  Right Ear: External ear normal.  Left Ear: External ear normal.  Nose: Nose normal.  Eyes: Right eye exhibits no discharge. Left eye exhibits no discharge.  Cardiovascular: Normal rate, regular rhythm and normal heart sounds.  Pulmonary/Chest: Effort normal and breath sounds normal.  Abdominal:  Soft. There is tenderness (mild, generalized "soreness").  Neurological: She is alert and oriented to person, place, and time.  Skin: Skin is warm and dry. She is not diaphoretic.  Nursing note and vitals reviewed.    ED Treatments / Results  Labs (all labs ordered are listed, but only abnormal results are displayed) Labs Reviewed  CBC - Abnormal; Notable for the following components:      Result Value   WBC 11.2 (*)    Platelets 465 (*)    All other components within normal limits  URINALYSIS, ROUTINE W REFLEX MICROSCOPIC - Abnormal; Notable for the following components:   Hgb urine dipstick LARGE (*)    Ketones, ur 15 (*)    Protein, ur 100 (*)    All other components within normal limits  COMPREHENSIVE METABOLIC PANEL - Abnormal; Notable for the following components:   Sodium 133 (*)    Chloride 98 (*)    Glucose, Bld 106 (*)    All other components within normal limits  URINALYSIS, MICROSCOPIC (REFLEX) - Abnormal; Notable for the following components:   Bacteria, UA FEW (*)    All other components within normal limits  TROPONIN I  LIPASE, BLOOD    EKG EKG Interpretation  Date/Time:  Tuesday March 10 2018 09:14:27 EDT Ventricular Rate:  60 PR Interval:    QRS Duration: 92 QT Interval:  442 QTC Calculation: 442 R Axis:   25 Text Interpretation:  Normal sinus rhythm no acute ST/T changes tachycardia resolved compared to Feb 2018 Confirmed by [name] [phone]) on 03/10/2018 9:31:36 AM   Radiology Dg Chest 2 View  Result Date: 03/10/2018 CLINICAL DATA:  Vomiting and fever since last night. EXAM: CHEST - 2 VIEW COMPARISON:  CT chest 02/05/2017.  PA and lateral chest 11/07/2017. FINDINGS: Right apical pulmonary nodule which measures 0.7 cm in diameter on the most recent plain films measures 1.4 cm today. Small nodular opacity in the periphery of the left upper lobe is unchanged. No corresponding nodule seen on the prior CT. The lungs are otherwise clear. Heart size  is normal. No pneumothorax or pleural effusion. No acute bony abnormality. IMPRESSION: Increase in the size of a right apical pulmonary nodule worrisome for carcinoma. CT chest with contrast is recommended for further evaluation. No acute disease. Electronically Signed   By: [name] M.D.   On: 03/10/2018 09:38   Ct Chest W Contrast  Result Date: 03/10/2018 CLINICAL DATA:  Abdominal pain and nausea.  Lung nodule, asthma. EXAM: CT CHEST WITH CONTRAST TECHNIQUE: Multidetector CT imaging of the chest was performed during intravenous contrast administration. CONTRAST:  30mL ISOVUE-300 IOPAMIDOL (ISOVUE-300) INJECTION 61% COMPARISON:  Chest radiograph 03/10/2018. CT chest 02/05/2017 and 11/07/2016. FINDINGS: Cardiovascular: Atherosclerotic calcification of  the arterial vasculature, including coronary arteries. Heart size normal. No pericardial effusion. Mediastinum/Nodes: No pathologically enlarged mediastinal, hilar or axillary lymph nodes. Esophagus is grossly unremarkable. Lungs/Pleura: Apical segment right upper lobe nodule is slightly spiculated and larger, measuring 1.2 x 1.6 cm, previously 1.0 x 1.2 cm. An adjacent smaller nodule measures 10 mm, previously 7 mm. Mild centrilobular and paraseptal emphysema. Lungs are otherwise clear. No pleural fluid. Airway is unremarkable. Upper Abdomen: Visualized portions of the liver, gallbladder and adrenal glands are unremarkable. Low-attenuation lesions in the kidneys measure up to 3.1 cm on the right and are likely cysts. Spleen is unremarkable. Calcification is seen in the tail of the pancreas. Visualized portions of the pancreas, stomach and bowel are otherwise unremarkable. No upper abdominal adenopathy. Musculoskeletal: Degenerative changes in the spine are mild. No worrisome lytic or sclerotic lesions. IMPRESSION: 1. Enlarging apical segment right upper lobe nodules, worrisome for primary bronchogenic carcinoma. Consider PET imaging in further evaluation,  as clinically indicated. 2. Aortic atherosclerosis (ICD10-170.0). Coronary artery calcification. 3.  Emphysema (ICD10-J43.9). Electronically Signed   By: Lorin Picket M.D.   On: 03/10/2018 10:44    Procedures Procedures (including critical care time)  Medications Ordered in ED Medications  ondansetron (ZOFRAN) injection 4 mg (4 mg Intravenous Given 03/10/18 0857)  sodium chloride 0.9 % bolus 1,000 mL (0 mLs Intravenous Stopped 03/10/18 1103)  iopamidol (ISOVUE-300) 61 % injection 100 mL (80 mLs Intravenous Contrast Given 03/10/18 1001)     Initial Impression / Assessment and Plan / ED Course  I have reviewed the triage vital signs and the nursing notes.  Pertinent labs & imaging results that were available during my care of the patient were reviewed by me and considered in my medical decision making (see chart for details).     The patient's nausea and vomiting has improved with IV Zofran and fluids.  She is now able to tolerate oral fluids.  She had some transient upper back pain, and thus ECG, troponin and chest x-ray obtained.  Chest x-ray shows nodule that is larger than before.  She is a former smoker.  CT obtained that shows concern for nodules consistent with primary bronchogenic carcinoma.  I discussed with Dr. [name], he will arrange for outpatient biopsy and follow-up with oncology if positive.  She understands the importance of this biopsy as well as the concern for possible cancer.  As for her vomiting, with the diarrhea this is probably gastroenteritis, though it could be a medication side effect.  I will discharge her with antiemetics but her abdominal exam is benign and I do not think acute imaging is needed.  Final Clinical Impressions(s) / ED Diagnoses   Final diagnoses:  Vomiting in adult  Lung nodule, multiple    ED Discharge Orders        Ordered    ondansetron (ZOFRAN ODT) 4 MG disintegrating tablet  Every 8 hours PRN     03/10/18 1131    promethazine  (PHENERGAN) 25 MG tablet  Every 6 hours PRN     03/10/18 1131       [name], MD 03/10/18 1517

## 2018-03-11 ENCOUNTER — Other Ambulatory Visit: Payer: Self-pay | Admitting: Oncology

## 2018-03-11 ENCOUNTER — Telehealth: Payer: Self-pay | Admitting: *Deleted

## 2018-03-11 DIAGNOSIS — R911 Solitary pulmonary nodule: Secondary | ICD-10-CM

## 2018-03-11 NOTE — Telephone Encounter (Signed)
"  This is Melissa Molina.  Someone just called me.  I think she said Melissa Molina, Dr. Virgie Dad nurse asking to speak with [name].  I need to speak with his nurse, Melissa Molina.  I know you're trying to give me appointment information to help but will you get me through to his nurse!"  Unable to provide future imaging appointment information scheduled by [facility] for Radiology with this call.

## 2018-03-17 ENCOUNTER — Ambulatory Visit: Payer: Self-pay | Admitting: Oncology

## 2018-03-20 ENCOUNTER — Ambulatory Visit (HOSPITAL_COMMUNITY)
Admission: RE | Admit: 2018-03-20 | Discharge: 2018-03-20 | Disposition: A | Discharge status: Home / Self Care | Payer: BLUE CROSS/BLUE SHIELD | Source: Ambulatory Visit | Attending: Oncology | Admitting: Oncology

## 2018-03-20 DIAGNOSIS — Z791 Long term (current) use of non-steroidal anti-inflammatories (NSAID): Secondary | ICD-10-CM | POA: Insufficient documentation

## 2018-03-20 DIAGNOSIS — Z79899 Other long term (current) drug therapy: Secondary | ICD-10-CM | POA: Insufficient documentation

## 2018-03-20 DIAGNOSIS — R911 Solitary pulmonary nodule: Secondary | ICD-10-CM | POA: Diagnosis not present

## 2018-03-20 LAB — GLUCOSE, CAPILLARY: GLUCOSE-CAPILLARY: 90 mg/dL (ref 65–99)

## 2018-03-20 MED ORDER — FLUDEOXYGLUCOSE F - 18 (FDG) INJECTION
8.6000 | Freq: Once | INTRAVENOUS | Status: AC | PRN
  Administered 2018-03-20: 8.6 via INTRAVENOUS

## 2018-03-23 ENCOUNTER — Other Ambulatory Visit: Payer: Self-pay | Admitting: Oncology

## 2018-03-24 ENCOUNTER — Other Ambulatory Visit: Payer: Self-pay | Admitting: Radiology

## 2018-03-25 ENCOUNTER — Ambulatory Visit (HOSPITAL_COMMUNITY)
Admission: RE | Admit: 2018-03-25 | Discharge: 2018-03-25 | Disposition: A | Discharge status: Home / Self Care | Payer: BLUE CROSS/BLUE SHIELD | Source: Ambulatory Visit | Attending: Oncology | Admitting: Oncology

## 2018-03-25 ENCOUNTER — Ambulatory Visit (HOSPITAL_COMMUNITY)
Admission: RE | Admit: 2018-03-25 | Discharge: 2018-03-25 | Disposition: A | Discharge status: Home / Self Care | Payer: BLUE CROSS/BLUE SHIELD | Source: Ambulatory Visit | Attending: Interventional Radiology | Admitting: Interventional Radiology

## 2018-03-25 ENCOUNTER — Encounter (HOSPITAL_COMMUNITY): Payer: Self-pay

## 2018-03-25 DIAGNOSIS — K219 Gastro-esophageal reflux disease without esophagitis: Secondary | ICD-10-CM | POA: Diagnosis not present

## 2018-03-25 DIAGNOSIS — I700 Atherosclerosis of aorta: Secondary | ICD-10-CM | POA: Insufficient documentation

## 2018-03-25 DIAGNOSIS — J850 Gangrene and necrosis of lung: Secondary | ICD-10-CM | POA: Insufficient documentation

## 2018-03-25 DIAGNOSIS — F329 Major depressive disorder, single episode, unspecified: Secondary | ICD-10-CM | POA: Insufficient documentation

## 2018-03-25 DIAGNOSIS — J95811 Postprocedural pneumothorax: Secondary | ICD-10-CM | POA: Diagnosis not present

## 2018-03-25 DIAGNOSIS — F17210 Nicotine dependence, cigarettes, uncomplicated: Secondary | ICD-10-CM | POA: Insufficient documentation

## 2018-03-25 DIAGNOSIS — Z79899 Other long term (current) drug therapy: Secondary | ICD-10-CM | POA: Insufficient documentation

## 2018-03-25 DIAGNOSIS — I10 Essential (primary) hypertension: Secondary | ICD-10-CM | POA: Diagnosis not present

## 2018-03-25 DIAGNOSIS — Z9851 Tubal ligation status: Secondary | ICD-10-CM | POA: Insufficient documentation

## 2018-03-25 DIAGNOSIS — J439 Emphysema, unspecified: Secondary | ICD-10-CM | POA: Insufficient documentation

## 2018-03-25 DIAGNOSIS — F419 Anxiety disorder, unspecified: Secondary | ICD-10-CM | POA: Insufficient documentation

## 2018-03-25 DIAGNOSIS — Z791 Long term (current) use of non-steroidal anti-inflammatories (NSAID): Secondary | ICD-10-CM | POA: Diagnosis not present

## 2018-03-25 DIAGNOSIS — R911 Solitary pulmonary nodule: Secondary | ICD-10-CM

## 2018-03-25 DIAGNOSIS — J60 Coalworker's pneumoconiosis: Secondary | ICD-10-CM | POA: Diagnosis not present

## 2018-03-25 LAB — CBC
HEMATOCRIT: 41.1 % (ref 36.0–46.0)
HEMOGLOBIN: 13.5 g/dL (ref 12.0–15.0)
MCH: 31.3 pg (ref 26.0–34.0)
MCHC: 32.8 g/dL (ref 30.0–36.0)
MCV: 95.4 fL (ref 78.0–100.0)
Platelets: 516 10*3/uL — ABNORMAL HIGH (ref 150–400)
RBC: 4.31 MIL/uL (ref 3.87–5.11)
RDW: 12.7 % (ref 11.5–15.5)
WBC: 9.8 10*3/uL (ref 4.0–10.5)

## 2018-03-25 LAB — PROTIME-INR
INR: 0.95
Prothrombin Time: 12.6 seconds (ref 11.4–15.2)

## 2018-03-25 MED ORDER — FENTANYL CITRATE (PF) 100 MCG/2ML IJ SOLN
INTRAMUSCULAR | Status: AC | PRN
  Administered 2018-03-25 (×3): 50 ug via INTRAVENOUS

## 2018-03-25 MED ORDER — FENTANYL CITRATE (PF) 100 MCG/2ML IJ SOLN
INTRAMUSCULAR | Status: AC
  Filled 2018-03-25: qty 4

## 2018-03-25 MED ORDER — MIDAZOLAM HCL 2 MG/2ML IJ SOLN
INTRAMUSCULAR | Status: AC | PRN
  Administered 2018-03-25 (×2): 1 mg via INTRAVENOUS

## 2018-03-25 MED ORDER — SODIUM CHLORIDE 0.9 % IV SOLN: INTRAVENOUS | Status: DC

## 2018-03-25 MED ORDER — LIDOCAINE HCL 1 % IJ SOLN
INTRAMUSCULAR | Status: AC
  Filled 2018-03-25: qty 20

## 2018-03-25 MED ORDER — MIDAZOLAM HCL 2 MG/2ML IJ SOLN
INTRAMUSCULAR | Status: AC
  Filled 2018-03-25: qty 4

## 2018-03-25 NOTE — Sedation Documentation (Signed)
Patient is resting comfortably. 

## 2018-03-25 NOTE — Discharge Instructions (Signed)
Needle Biopsy of the Lung, Care After °This sheet gives you information about how to care for yourself after your procedure. Your health care provider may also give you more specific instructions. If you have problems or questions, contact your health care provider. °What can I expect after the procedure? °After the procedure, it is common to have: °· Soreness, pain, and tenderness where a tissue sample was taken (biopsy site). °· A cough. °· A sore throat. ° °Follow these instructions at home: °Biopsy site care °· Follow instructions from your health care provider about when to remove the bandage that was placed on the biopsy site. °· Keep the bandage dry until it has been removed. °· Check your biopsy site every day for signs of infection. Check for: °? More redness, swelling, or pain. °? More fluid or blood. °? Warmth to the touch. °? Pus or a bad smell. °General instructions °· Rest as directed by your health care provider. Ask your health care provider what activities are safe for you. °· Do not take baths, swim, or use a hot tub until your health care provider approves. °· Take over-the-counter and prescription medicines only as told by your health care provider. °· If you have airplane travel scheduled, talk with your health care provider about when it is safe for you to travel by airplane. °· It is up to you to get the results of your procedure. Ask your health care provider, or the department that is doing the procedure, when your results will be ready. °· Keep all follow-up visits as told by your health care provider. This is important. °Contact a health care provider if: °· You have more redness, swelling, or pain around your biopsy site. °· You have more fluid or blood coming from your biopsy site. °· Your biopsy site feels warm to the touch. °· You have pus or a bad smell coming from your biopsy site. °· You have a fever. °· You have pain that does not get better with medicine. °Get help right away  if: °· You have problems breathing. °· You have chest pain. °· You cough up blood. °· You faint. °· You have a fast heart rate. °Summary °· After a needle biopsy of the lung, it is common to have a cough, a sore throat, or soreness, pain, and tenderness where a tissue sample was taken (biopsy site). °· You should check your biopsy area every day for signs of infection, including pus or a bad smell, warmth, more fluid or blood, or more redness, swelling, or pain. °· You should not take baths, swim, or use a hot tub until your health care provider approves. °· It is up to you to get the results of your procedure. Ask your health care provider, or the department that is doing the procedure, when your results will be ready. °This information is not intended to replace advice given to you by your health care provider. Make sure you discuss any questions you have with your health care provider. °Document Released: 07/14/2007 Document Revised: 08/07/2016 Document Reviewed: 08/07/2016 °Elsevier Interactive Patient Education © 2017 [employer] ° ° °

## 2018-03-25 NOTE — Procedures (Signed)
Interventional Radiology Procedure Note  Procedure: CT guided biopsy of RUL nodule biopsy.  Mx 18g core bx.  Complications: None Recommendations: - Bedrest until CXR cleared.  Minimize talking, coughing or otherwise straining.  - Follow up 1 hr CXR pending  - NPO until CXR cleared  Signed,  Corrie Mckusick, DO

## 2018-03-25 NOTE — H&P (Signed)
Chief Complaint: Patient was seen in consultation today for right lung lesion biopsy at the request of Magrinat,[name]  Referring Physician(s): Chauncey Cruel  Supervising Physician: Corrie Mckusick  Patient Status: [name]  History of Present Illness: Melissa Molina is a 57 y.o. female   Pt had developed N/V and fever early June  To ED and work included CXR Abnormal finding led to CT  6/11 CT:  IMPRESSION: 1. Enlarging apical segment right upper lobe nodules, worrisome for primary bronchogenic carcinoma. Consider PET imaging in further evaluation, as clinically indicated. 2. Aortic atherosclerosis (ICD10-170.0). Coronary artery calcification. 3.  Emphysema (ICD10-J43.9).  6/22 PET:  IMPRESSION: Lobulated 19 x 16 mm right apical lung lesion is weakly hypermetabolic but suspicious for neoplastic process. Recommend biopsy. No enlarged or hypermetabolic mediastinal or hilar lymph nodes or evidence of metastatic disease elsewhere  Scheduled now for same per Dr [name] Oncology   Past Medical History:  Diagnosis Date  . Anxiety   . Asthma   . COPD (chronic obstructive pulmonary disease) ([place])   . Depression   . GERD (gastroesophageal reflux disease)   . Hypertension     Past Surgical History:  Procedure Laterality Date  . TUBAL LIGATION      Allergies: Patient has no known allergies.  Medications: Prior to Admission medications   Medication Sig Start Date End Date Taking? Authorizing Provider  hydrochlorothiazide (MICROZIDE) 12.5 MG capsule Take 12.5 mg by mouth daily.   Yes [provider]  lisinopril (PRINIVIL,ZESTRIL) 5 MG tablet Take 5 mg by mouth daily.   Yes [provider]  cyclobenzaprine (FLEXERIL) 10 MG tablet Take 1 tablet (10 mg total) by mouth 2 (two) times daily as needed for muscle spasms. 11/07/16   Montine Circle, PA-C  DULoxetine (CYMBALTA) 60 MG capsule Take 60 mg by mouth daily.    [provider]    gabapentin (NEURONTIN) 100 MG capsule Take 100 mg by mouth 3 (three) times daily.    [provider]  lovastatin (ALTOPREV) 20 MG 24 hr tablet Take 20 mg by mouth at bedtime.    [provider]  meloxicam (MOBIC) 7.5 MG tablet Take 7.5 mg by mouth daily.    [provider]  ondansetron (ZOFRAN ODT) 4 MG disintegrating tablet Take 1 tablet (4 mg total) by mouth every 8 (eight) hours as needed. 03/10/18   [name], MD  promethazine (PHENERGAN) 25 MG tablet Take 1 tablet (25 mg total) by mouth every 6 (six) hours as needed for nausea or vomiting. 03/10/18   [name], MD  ranitidine (ZANTAC) 75 MG tablet Take 75 mg by mouth 2 (two) times daily.    [provider]     History reviewed. No pertinent family history.  Social History   Socioeconomic History  . Marital status: Married    Spouse name: Not on file  . Number of children: Not on file  . Years of education: Not on file  . Highest education level: Not on file  Occupational History  . Not on file  Social Needs  . Financial resource strain: Not on file  . Food insecurity:    Worry: Not on file    Inability: Not on file  . Transportation needs:    Medical: Not on file    Non-medical: Not on file  Tobacco Use  . Smoking status: Current Every Day Smoker    Packs/day: 0.50    Types: Cigarettes  . Smokeless tobacco: Never Used  Substance  and Sexual Activity  . Alcohol use: No  . Drug use: Yes    Types: Marijuana  . Sexual activity: Not on file  Lifestyle  . Physical activity:    Days per week: Not on file    Minutes per session: Not on file  . Stress: Not on file  Relationships  . Social connections:    Talks on phone: Not on file    Gets together: Not on file    Attends religious service: Not on file    Active member of club or organization: Not on file    Attends meetings of clubs or organizations: Not on file    Relationship status: Not on file  Other Topics Concern  .  Not on file  Social History Narrative  . Not on file     Review of Systems: A 12 point ROS discussed and pertinent positives are indicated in the HPI above.  All other systems are negative.  Review of Systems  Constitutional: Negative for activity change, fatigue and fever.  Respiratory: Positive for cough. Negative for shortness of breath.   Gastrointestinal: Negative for abdominal pain.  Neurological: Negative for weakness.  Psychiatric/Behavioral: Negative for behavioral problems and confusion.    Vital Signs: BP 139/66 (BP Location: Right Arm)   Pulse 63   Temp 98.2 F (36.8 C) (Oral)   Ht 5\' 6"  (1.676 m)   Wt 178 lb (80.7 kg)   SpO2 100%   BMI 28.73 kg/m   Physical Exam  Constitutional: She is oriented to person, place, and time.  Cardiovascular: Normal rate, regular rhythm and normal heart sounds.  Pulmonary/Chest: Effort normal and breath sounds normal. She has no wheezes.  Abdominal: Soft. Bowel sounds are normal.  Musculoskeletal: Normal range of motion.  Neurological: She is alert and oriented to person, place, and time.  Skin: Skin is warm and dry.  Psychiatric: She has a normal mood and affect. Her behavior is normal. Judgment and thought content normal.  Nursing note and vitals reviewed.   Imaging: Dg Chest 2 View  Result Date: 03/10/2018 CLINICAL DATA:  Vomiting and fever since last night. EXAM: CHEST - 2 VIEW COMPARISON:  CT chest 02/05/2017.  PA and lateral chest 11/07/2017. FINDINGS: Right apical pulmonary nodule which measures 0.7 cm in diameter on the most recent plain films measures 1.4 cm today. Small nodular opacity in the periphery of the left upper lobe is unchanged. No corresponding nodule seen on the prior CT. The lungs are otherwise clear. Heart size is normal. No pneumothorax or pleural effusion. No acute bony abnormality. IMPRESSION: Increase in the size of a right apical pulmonary nodule worrisome for carcinoma. CT chest with contrast is  recommended for further evaluation. No acute disease. Electronically Signed   By: [name] M.D.   On: 03/10/2018 09:38   Ct Chest W Contrast  Result Date: 03/10/2018 CLINICAL DATA:  Abdominal pain and nausea.  Lung nodule, asthma. EXAM: CT CHEST WITH CONTRAST TECHNIQUE: Multidetector CT imaging of the chest was performed during intravenous contrast administration. CONTRAST:  79mL ISOVUE-300 IOPAMIDOL (ISOVUE-300) INJECTION 61% COMPARISON:  Chest radiograph 03/10/2018. CT chest 02/05/2017 and 11/07/2016. FINDINGS: Cardiovascular: Atherosclerotic calcification of the arterial vasculature, including coronary arteries. Heart size normal. No pericardial effusion. Mediastinum/Nodes: No pathologically enlarged mediastinal, hilar or axillary lymph nodes. Esophagus is grossly unremarkable. Lungs/Pleura: Apical segment right upper lobe nodule is slightly spiculated and larger, measuring 1.2 x 1.6 cm, previously 1.0 x 1.2 cm. An adjacent smaller nodule measures 10 mm,  previously 7 mm. Mild centrilobular and paraseptal emphysema. Lungs are otherwise clear. No pleural fluid. Airway is unremarkable. Upper Abdomen: Visualized portions of the liver, gallbladder and adrenal glands are unremarkable. Low-attenuation lesions in the kidneys measure up to 3.1 cm on the right and are likely cysts. Spleen is unremarkable. Calcification is seen in the tail of the pancreas. Visualized portions of the pancreas, stomach and bowel are otherwise unremarkable. No upper abdominal adenopathy. Musculoskeletal: Degenerative changes in the spine are mild. No worrisome lytic or sclerotic lesions. IMPRESSION: 1. Enlarging apical segment right upper lobe nodules, worrisome for primary bronchogenic carcinoma. Consider PET imaging in further evaluation, as clinically indicated. 2. Aortic atherosclerosis (ICD10-170.0). Coronary artery calcification. 3.  Emphysema (ICD10-J43.9). Electronically Signed   By: Lorin Picket M.D.   On: 03/10/2018  10:44   Nm Pet Image Initial (pi) Skull Base To Thigh  Result Date: 03/21/2018 CLINICAL DATA:  Initial treatment strategy for right upper lobe pulmonary nodule. EXAM: NUCLEAR MEDICINE PET SKULL BASE TO THIGH TECHNIQUE: 8.6 mCi F-18 FDG was injected intravenously. Full-ring PET imaging was performed from the skull base to thigh after the radiotracer. CT data was obtained and used for attenuation correction and anatomic localization. Fasting blood glucose: 90 mg/dl COMPARISON:  Chest CT 03/10/2018 and prior study 02/05/2017 FINDINGS: Mediastinal blood pool activity: SUV max 2.53 NECK: No hypermetabolic lymph nodes in the neck. Incidental CT findings: none CHEST: The lobulated 19 x 16 mm right apical lung lesion is weakly hypermetabolic with SUV max of 6.29. It has clearly enlarged since prior CT scan from February 2018 and is suspicious for a neoplastic process. Recommend biopsy. No enlarged or hypermetabolic mediastinal or hilar lymph nodes. Stable underlying mild emphysematous changes but no other pulmonary nodules or new/acute pulmonary findings. Incidental CT findings: none ABDOMEN/PELVIS: No abnormal hypermetabolic activity within the liver, pancreas, adrenal glands, or spleen. No hypermetabolic lymph nodes in the abdomen or pelvis. Incidental CT findings: Lower pole left renal calculus and right renal cyst. Stable advanced atherosclerotic calcifications involving the aorta and iliac arteries. SKELETON: No focal hypermetabolic activity to suggest skeletal metastasis. Incidental CT findings: none IMPRESSION: Lobulated 19 x 16 mm right apical lung lesion is weakly hypermetabolic but suspicious for neoplastic process. Recommend biopsy. No enlarged or hypermetabolic mediastinal or hilar lymph nodes or evidence of metastatic disease elsewhere. Electronically Signed   By: Marijo Sanes M.D.   On: 03/21/2018 10:57    Labs:  CBC: Recent Labs    03/10/18 0859 03/25/18 0934  WBC 11.2* 9.8  HGB 14.1 13.5    HCT 40.0 41.1  PLT 465* 516*    COAGS: No results for input(s): INR, APTT in the last 8760 hours.  BMP: Recent Labs    03/10/18 1016  NA 133*  K 3.5  CL 98*  CO2 25  GLUCOSE 106*  BUN 11  CALCIUM 9.0  CREATININE 0.77  GFRNONAA >60  GFRAA >60    LIVER FUNCTION TESTS: Recent Labs    03/10/18 1016  BILITOT 0.8  AST 22  ALT 15  ALKPHOS 95  PROT 8.1  ALBUMIN 4.2    TUMOR MARKERS: No results for input(s): AFPTM, CEA, CA199, CHROMGRNA in the last 8760 hours.  Assessment and Plan:  RUL lesion Enlarging and +PET Now scheduled for biopsy per Dr [name] Risks and benefits discussed with the patient including, but not limited to bleeding, hemoptysis, respiratory failure requiring intubation, infection, pneumothorax requiring chest tube placement, stroke from air embolism or even death.  All of  the patient's questions were answered, patient is agreeable to proceed. Consent signed and in chart.   Thank you for this interesting consult.  I greatly enjoyed meeting Carlyle Achenbach and look forward to participating in their care.  A copy of this report was sent to the requesting provider on this date.  Electronically Signed: Lavonia Drafts, PA-C 03/25/2018, 10:50 AM   I spent a total of  30 Minutes   in face to face in clinical consultation, greater than 50% of which was counseling/coordinating care for right lung lesion biopsy

## 2018-03-25 NOTE — Sedation Documentation (Signed)
Bandaid R upper back   

## 2018-03-25 NOTE — Progress Notes (Signed)
Dr. Earleen Newport aware of CXR result, OK to D/C home.

## 2018-03-25 NOTE — Sedation Documentation (Signed)
Pt and family waiting to see MD

## 2018-03-26 ENCOUNTER — Encounter: Payer: Self-pay | Admitting: *Deleted

## 2018-03-26 ENCOUNTER — Other Ambulatory Visit: Payer: Self-pay | Admitting: Oncology

## 2018-03-26 DIAGNOSIS — R911 Solitary pulmonary nodule: Secondary | ICD-10-CM

## 2018-03-26 NOTE — Progress Notes (Signed)
Oncology Nurse Navigator Documentation  Oncology Nurse Navigator Flowsheets 03/26/2018  Navigator Location [facility]  Navigator Encounter Type Other/per Dr. [name] and Dr. Julien Nordmann referral to [facility] completed and will update their office.   Treatment Phase Abnormal Scans  Barriers/Navigation Needs Coordination of Care  Interventions Coordination of Care  Coordination of Care Other  Acuity Level 2  Time Spent with Patient 30

## 2018-03-26 NOTE — Progress Notes (Unsigned)
Called the patient and had a long discussion with her regarding the biopsy obtained 03/25/2018, which showed (SZ a 19-11/28/2001) abundant necrosis and inflamed lung, but no evidence of malignancy.  I explained to them that I am not sure what we are dealing with.  I think the best option at this point is to meet with thoracic surgery and discuss with them whether they would like to follow-up on this radiologically or go ahead and remove it, which might be the very safest thing to do.  They are in agreement with this.  We are placing the referral through the thoracic surgery group here.

## 2018-04-08 ENCOUNTER — Encounter: Payer: Self-pay | Admitting: Cardiothoracic Surgery

## 2018-04-15 ENCOUNTER — Institutional Professional Consult (permissible substitution) (INDEPENDENT_AMBULATORY_CARE_PROVIDER_SITE_OTHER): Payer: BLUE CROSS/BLUE SHIELD | Admitting: Cardiothoracic Surgery

## 2018-04-15 ENCOUNTER — Other Ambulatory Visit: Payer: Self-pay | Admitting: Oncology

## 2018-04-15 ENCOUNTER — Encounter: Payer: Self-pay | Admitting: Cardiothoracic Surgery

## 2018-04-15 VITALS — BP 136/100 | HR 70 | Resp 20 | Ht 66.0 in | Wt 174.0 lb

## 2018-04-15 DIAGNOSIS — R911 Solitary pulmonary nodule: Secondary | ICD-10-CM

## 2018-04-15 NOTE — Progress Notes (Addendum)
[facility] Referring Provider is Magrinat, Virgie Dad, MD  Chief Complaint  Patient presents with  . Lung Lesion    Surgical eval, PET Scan 03/20/18, Chest CT 03/10/18, CT BX 03/25/18  Patient examined. Images of CT scan of chest and PET scan personally reviewed and counseled with patient    HPI: Patient presents for evaluation of a right upper lobe subpleural apical nodule which has slowly grown in size over the past year and a half from 12 mm to almost 2 cm with recent PET scan showing weak hypermetabolic activity with SUV 2.7.  The patient has history of smoking but stopped approximately 1 year ago.  The slowly growing nodule is asymptomatic.  She has had no systemic symptoms of weight loss focal pain or neurologic symptoms.  The patient underwent a transthoracic needle aspirate of the right apical nodule which returned necrotic tissue no evidence of malignancy.  Because the nodule has been definitely increasing in size recommendation for thoracic surgical evaluation and resection has been made which is very appropriate.  The patient denies previous pulmonary disease other than isolated episodes of pneumonia treated as outpatient.  Essentially no history of thoracic trauma for rib fracture or pneumothorax.  The patient has a high functional level and currently works in a [employer] as a Engineer, agricultural shifts.  The patient has no history of cardiac disease but has had a murmur since her childhood.  No echocardiogram has been performed.  She has hypertension but denies atrial fibrillation or angina.  Past Medical History:  Diagnosis Date  . Anxiety   . Asthma   . COPD (chronic obstructive pulmonary disease) ([place])   . Depression   . GERD (gastroesophageal reflux disease)   . Hypertension     Past Surgical History:  Procedure Laterality Date  . TUBAL LIGATION      No family history on file.  Social History Social History   Tobacco Use  . Smoking  status: Current Every Day Smoker    Packs/day: 0.50    Types: Cigarettes  . Smokeless tobacco: Never Used  Substance Use Topics  . Alcohol use: No  . Drug use: Yes    Types: Marijuana    Current Outpatient Medications  Medication Sig Dispense Refill  . DULoxetine (CYMBALTA) 60 MG capsule Take 60 mg by mouth daily.    Marland Kitchen gabapentin (NEURONTIN) 100 MG capsule Take 100 mg by mouth 3 (three) times daily.    . hydrochlorothiazide (MICROZIDE) 12.5 MG capsule Take 12.5 mg by mouth daily.    Marland Kitchen lisinopril (PRINIVIL,ZESTRIL) 5 MG tablet Take 5 mg by mouth daily.    Marland Kitchen lovastatin (ALTOPREV) 20 MG 24 hr tablet Take 20 mg by mouth at bedtime.    . meloxicam (MOBIC) 7.5 MG tablet Take 7.5 mg by mouth daily.    . ranitidine (ZANTAC) 75 MG tablet Take 75 mg by mouth 2 (two) times daily.     No current facility-administered medications for this visit.     No Known Allergies  Review of Systems   The patient has right leg claudication.  Patient admits to anxiety and depression .  Patient has a morning productive cough but no hemoptysis. Weight has been stable.  Her primary care physician is in [facility] patient has symptoms of GERD But no dysphagia. Patient has several broken teeth and a loose upper front tooth.  BP (!) 136/100   Pulse 70   Resp 20   Ht 5'  6" (1.676 m)   Wt 174 lb (78.9 kg)   SpO2 98% Comment: RA  BMI 28.08 kg/m  Physical Exam      Exam    General- alert and comfortable    Neck- no JVD, no cervical adenopathy palpable, no carotid bruit   Lungs- clear without rales, wheezes   Cor- regular rate and rhythm, 2/6 holosystolic murmur , no gallop   Abdomen- soft, non-tender   Extremities - warm, non-tender, minimal edema   Neuro- oriented, appropriate, no focal weakness   Diagnostic Tests: PET scan and CT scans all personally reviewed  Impression: Slowly growing right upper lobe apical nodule with mild hypermetabolic uptake on PET scan.  Trans-thoracic CT-guided biopsy  nondiagnostic.  I have recommended to the patient that she have a VATS excisional biopsy to remove the lesion.  Prior to surgery she will need PFTs, echocardiogram.  Brain MRI will be needed to rule out metastatic disease  Plan: Patient will return back to the office after the above studies are completed to discuss more details regarding surgery including benefits and risks.   Len Childs, MD [facility] [phone]

## 2018-04-16 ENCOUNTER — Other Ambulatory Visit: Payer: Self-pay | Admitting: Oncology

## 2018-04-16 ENCOUNTER — Other Ambulatory Visit: Payer: Self-pay | Admitting: *Deleted

## 2018-04-16 DIAGNOSIS — Z01818 Encounter for other preprocedural examination: Secondary | ICD-10-CM

## 2018-04-16 DIAGNOSIS — R011 Cardiac murmur, unspecified: Secondary | ICD-10-CM

## 2018-04-16 DIAGNOSIS — R911 Solitary pulmonary nodule: Secondary | ICD-10-CM

## 2018-04-20 ENCOUNTER — Encounter (HOSPITAL_COMMUNITY): Payer: Self-pay | Admitting: Vascular Surgery

## 2018-04-20 ENCOUNTER — Ambulatory Visit (HOSPITAL_COMMUNITY)
Admission: RE | Admit: 2018-04-20 | Discharge: 2018-04-20 | Disposition: A | Discharge status: Home / Self Care | Payer: BLUE CROSS/BLUE SHIELD | Source: Ambulatory Visit | Attending: Cardiothoracic Surgery | Admitting: Cardiothoracic Surgery

## 2018-04-20 ENCOUNTER — Encounter (HOSPITAL_COMMUNITY): Payer: Self-pay | Admitting: *Deleted

## 2018-04-20 DIAGNOSIS — Z01818 Encounter for other preprocedural examination: Secondary | ICD-10-CM

## 2018-04-20 DIAGNOSIS — R911 Solitary pulmonary nodule: Secondary | ICD-10-CM | POA: Insufficient documentation

## 2018-04-20 DIAGNOSIS — R011 Cardiac murmur, unspecified: Secondary | ICD-10-CM | POA: Diagnosis present

## 2018-04-20 DIAGNOSIS — J449 Chronic obstructive pulmonary disease, unspecified: Secondary | ICD-10-CM | POA: Diagnosis not present

## 2018-04-20 DIAGNOSIS — K219 Gastro-esophageal reflux disease without esophagitis: Secondary | ICD-10-CM | POA: Diagnosis not present

## 2018-04-20 DIAGNOSIS — I351 Nonrheumatic aortic (valve) insufficiency: Secondary | ICD-10-CM | POA: Insufficient documentation

## 2018-04-20 DIAGNOSIS — I10 Essential (primary) hypertension: Secondary | ICD-10-CM | POA: Insufficient documentation

## 2018-04-20 LAB — PULMONARY FUNCTION TEST
DL/VA % pred: 70 %
DL/VA: 3.57 ml/min/mmHg/L
DLCO unc % pred: 57 %
DLCO unc: 15.53 ml/min/mmHg
FEF 25-75 Post: 0.82 L/sec
FEF 25-75 Pre: 0.67 L/sec
FEF2575-%Change-Post: 21 %
FEF2575-%Pred-Post: 35 %
FEF2575-%Pred-Pre: 29 %
FEV1-%Change-Post: 10 %
FEV1-%Pred-Post: 76 %
FEV1-%Pred-Pre: 68 %
FEV1-Post: 1.77 L
FEV1-Pre: 1.6 L
FEV1FVC-%Change-Post: 7 %
FEV1FVC-%Pred-Pre: 74 %
FEV6-%Change-Post: 3 %
FEV6-%Pred-Post: 90 %
FEV6-%Pred-Pre: 87 %
FEV6-Post: 2.59 L
FEV6-Pre: 2.49 L
FEV6FVC-%Change-Post: 0 %
FEV6FVC-%Pred-Post: 96 %
FEV6FVC-%Pred-Pre: 96 %
FVC-%Change-Post: 2 %
FVC-%Pred-Post: 93 %
FVC-%Pred-Pre: 91 %
FVC-Post: 2.76 L
FVC-Pre: 2.7 L
Post FEV1/FVC ratio: 64 %
Post FEV6/FVC ratio: 94 %
Pre FEV1/FVC ratio: 59 %
Pre FEV6/FVC Ratio: 94 %
RV % pred: 153 %
RV: 3.14 L
TLC % pred: 109 %
TLC: 5.86 L

## 2018-04-20 MED ORDER — ALBUTEROL SULFATE (2.5 MG/3ML) 0.083% IN NEBU
2.5000 mg | INHALATION_SOLUTION | Freq: Once | RESPIRATORY_TRACT | Status: AC
  Administered 2018-04-20: 2.5 mg via RESPIRATORY_TRACT

## 2018-04-20 NOTE — Progress Notes (Signed)
*  PRELIMINARY RESULTS* Echocardiogram 2D Echocardiogram has been performed.  Darlina Sicilian M 04/20/2018, 1:12 PM

## 2018-04-28 ENCOUNTER — Other Ambulatory Visit: Payer: Self-pay

## 2018-05-05 ENCOUNTER — Ambulatory Visit
Admission: RE | Admit: 2018-05-05 | Discharge: 2018-05-05 | Disposition: A | Discharge status: Home / Self Care | Payer: BLUE CROSS/BLUE SHIELD | Source: Ambulatory Visit | Attending: Cardiothoracic Surgery | Admitting: Cardiothoracic Surgery

## 2018-05-05 DIAGNOSIS — Z01818 Encounter for other preprocedural examination: Secondary | ICD-10-CM

## 2018-05-05 DIAGNOSIS — R911 Solitary pulmonary nodule: Secondary | ICD-10-CM

## 2018-05-05 MED ORDER — GADOBENATE DIMEGLUMINE 529 MG/ML IV SOLN
15.0000 mL | Freq: Once | INTRAVENOUS | Status: AC | PRN
  Administered 2018-05-05: 15 mL via INTRAVENOUS

## 2018-05-06 ENCOUNTER — Other Ambulatory Visit: Payer: Self-pay | Admitting: *Deleted

## 2018-05-06 ENCOUNTER — Encounter: Payer: Self-pay | Admitting: *Deleted

## 2018-05-06 ENCOUNTER — Ambulatory Visit (INDEPENDENT_AMBULATORY_CARE_PROVIDER_SITE_OTHER): Payer: BLUE CROSS/BLUE SHIELD | Admitting: Cardiothoracic Surgery

## 2018-05-06 ENCOUNTER — Other Ambulatory Visit: Payer: Self-pay

## 2018-05-06 ENCOUNTER — Encounter: Payer: Self-pay | Admitting: Cardiothoracic Surgery

## 2018-05-06 VITALS — BP 143/94 | HR 80 | Resp 16 | Ht 66.0 in | Wt 174.0 lb

## 2018-05-06 DIAGNOSIS — R911 Solitary pulmonary nodule: Secondary | ICD-10-CM | POA: Diagnosis not present

## 2018-05-06 NOTE — Progress Notes (Signed)
[facility]P is Inc, [facility] Referring Provider is Magrinat, Virgie Dad, MD  Chief Complaint  Patient presents with  . Lung Lesion    RULobe..further discussion for surgery after PFTS, ECHO 04/20/18 and BRAIN MRI 05/05/18    HPI: Patient returns for discussion of a 2 cm right upper lobe subpleural nodular mass with SUV activity 2.7 on PET scan and no evidence of mediastinal involvement or distant metastatic disease.  She is a reformed smoker.    Initial consult July 2019  Patient presents for evaluation of a right upper lobe subpleural apical nodule which has slowly grown in size over the past year and a half from 12 mm to almost 2 cm with recent PET scan showing weak hypermetabolic activity with SUV 2.7.  The patient has history of smoking but stopped approximately 1 year ago.  The slowly growing nodule is asymptomatic.  She has had no systemic symptoms of weight loss focal pain or neurologic symptoms.   The patient underwent a transthoracic needle aspirate of the right apical nodule which returned necrotic tissue no evidence of malignancy.   Because the nodule has been definitely increasing in size recommendation for thoracic surgical evaluation and resection has been made which is very appropriate.   The patient denies previous pulmonary disease other than isolated episodes of pneumonia treated as outpatient.  Essentially no history of thoracic trauma for rib fracture or pneumothorax.   The patient has a high functional level and currently works in a [employer] as a Engineer, agricultural shifts.   ----------  Since her initial evaluation patient is undergone echocardiogram showing normal LV function with mild aortic sclerosis but no stenosis which is the cause of her cardiac murmur.  Brain MRI showed no evidence of metastatic disease  PFTs showed mild COPD with FVC 2.7[91%] and FEV1 1.6 , [68%] With diffusion capacity 57% predicted.  I ambulated the patient in the hallway 200  feet and her room air saturation at rest 95% increased to 98% after exercise.  I recommended the patient undergo excisional biopsy of the right upper lobe mass with possible lobectomy if indicated for a malignancy. Past Medical History:  Diagnosis Date  . Anxiety   . Asthma   . COPD (chronic obstructive pulmonary disease) ([place])   . Depression   . GERD (gastroesophageal reflux disease)   . Hypertension     Past Surgical History:  Procedure Laterality Date  . TUBAL LIGATION      No family history on file.  Social History Social History   Tobacco Use  . Smoking status: Current Every Day Smoker    Packs/day: 0.50    Types: Cigarettes  . Smokeless tobacco: Never Used  Substance Use Topics  . Alcohol use: No  . Drug use: Yes    Types: Marijuana    Current Outpatient Medications  Medication Sig Dispense Refill  . DULoxetine (CYMBALTA) 60 MG capsule Take 60 mg by mouth daily.    Marland Kitchen gabapentin (NEURONTIN) 100 MG capsule Take 100 mg by mouth 3 (three) times daily.    . hydrochlorothiazide (MICROZIDE) 12.5 MG capsule Take 12.5 mg by mouth daily.    Marland Kitchen lisinopril (PRINIVIL,ZESTRIL) 5 MG tablet Take 5 mg by mouth daily.    Marland Kitchen lovastatin (ALTOPREV) 20 MG 24 hr tablet Take 20 mg by mouth at bedtime.    . meloxicam (MOBIC) 7.5 MG tablet Take 7.5 mg by mouth daily.    . ranitidine (ZANTAC) 75 MG tablet Take 75 mg by  mouth 2 (two) times daily.     No current facility-administered medications for this visit.     No Known Allergies  Review of Systems  No change since initial consultation  BP (!) 143/94 (BP Location: Left Arm, Patient Position: Sitting, Cuff Size: Normal)   Pulse 80   Resp 16   Ht 5\' 6"  (1.676 m)   Wt 174 lb (78.9 kg)   SpO2 98% Comment: ON RA  BMI 28.08 kg/m  Physical Exam      Exam    General- alert and comfortable    Neck- no JVD, no cervical adenopathy palpable, no carotid bruit   Lungs- clear without rales, wheezes   Cor- regular rate and rhythm, no  murmur , gallop   Abdomen- soft, non-tender   Extremities - warm, non-tender, minimal edema   Neuro- oriented, appropriate, no focal weakness  Diagnostic Tests: PET scan, brain MRI and PFT results all discussed with patient  Impression: 2.0 cm right upper lobe nodule, subpleural at the apex with mild metabolic activity on PET scan and nondiagnostic needle biopsy.  This could well represent an early stage lung cancer and should be resected with excisional biopsy and possible further lobectomy if indicated.  Plan: I discussed the procedure of right VATS, wedge resection, lobectomy, with the patient including the indications benefits alternatives and risks including bleeding, prolonged air leak, postoperative pneumonia, postoperative infection, and death.  She demonstrates her understanding and agrees to proceed with surgery.  Surgery will be scheduled on August 15 at [facility], right VATS, wedge resection, possible lobectomy.   Len Childs, MD [facility] [phone]

## 2018-05-11 NOTE — Pre-Procedure Instructions (Signed)
KEIASIA CHRISTIANSON  05/11/2018      [facility] [address] [place] [address] Phone: [phone] Fax: [phone]    Your procedure is scheduled on Thursday, August 15th.  Report to [facility] Admitting at 5:30 A.M.  Call this number if you have problems the morning of surgery:  [phone]   Remember:  Do not eat or drink after midnight, Wednesday.    Take these medicines the morning of surgery with A SIP OF WATER  DULoxetine (CYMBALTA) 60 MG capsule gabapentin (NEURONTIN) 100 MG capsule ranitidine (ZANTAC) 150 MG tablet  As needed: acetaminophen (TYLENOL) 500 MG tablet Tetrahydrozoline HCl (VISINE OP)  7 days prior to surgery STOP taking any Aspirin(unless otherwise instructed by your surgeon), Aleve, Naproxen, Ibuprofen, Motrin, Advil, Goody's, BC's, all herbal medications, fish oil, and all vitamins     Do not wear jewelry, make-up or nail polish.  Do not wear lotions, powders, or perfumes, or deodorant.  Do not shave 48 hours prior to surgery.  Do not bring valuables to the hospital.  [facility] is not responsible for any belongings or valuables.  Contacts, dentures or bridgework may not be worn into surgery.  Leave your suitcase in the car.  After surgery it may be brought to your room.  For patients admitted to the hospital, discharge time will be determined by your treatment team.  Patients discharged the day of surgery will not be allowed to drive home.   Special instructions:   [facility]- Preparing For Surgery  Before surgery, you can play an important role. Because skin is not sterile, your skin needs to be as free of germs as possible. You can reduce the number of germs on your skin by washing with CHG (chlorahexidine gluconate) Soap before surgery.  CHG is an antiseptic cleaner which kills germs and bonds with the skin to continue killing germs even after washing.    Oral  Hygiene is also important to reduce your risk of infection.  Remember - BRUSH YOUR TEETH THE MORNING OF SURGERY WITH YOUR REGULAR TOOTHPASTE  Please do not use if you have an allergy to CHG or antibacterial soaps. If your skin becomes reddened/irritated stop using the CHG.  Do not shave (including legs and underarms) for at least 48 hours prior to first CHG shower. It is OK to shave your face.  Please follow these instructions carefully.   1. Shower the NIGHT BEFORE SURGERY and the MORNING OF SURGERY with CHG.   2. If you chose to wash your hair, wash your hair first as usual with your normal shampoo.  3. After you shampoo, rinse your hair and body thoroughly to remove the shampoo.  4. Use CHG as you would any other liquid soap. You can apply CHG directly to the skin and wash gently with a scrungie or a clean washcloth.   5. Apply the CHG Soap to your body ONLY FROM THE NECK DOWN.  Do not use on open wounds or open sores. Avoid contact with your eyes, ears, mouth and genitals (private parts). Wash Face and genitals (private parts)  with your normal soap.  6. Wash thoroughly, paying special attention to the area where your surgery will be performed.  7. Thoroughly rinse your body with warm water from the neck down.  8. DO NOT shower/wash with your normal soap after using and rinsing off the CHG Soap.  9. [place]  yourself dry with a CLEAN TOWEL.  10. Wear CLEAN PAJAMAS to bed the night before surgery, wear comfortable clothes the morning of surgery  11. Place CLEAN SHEETS on your bed the night of your first shower and DO NOT SLEEP WITH PETS.    Day of Surgery:  Do not apply any deodorants/lotions.  Please wear clean clothes to the hospital/surgery center.   Remember to brush your teeth WITH YOUR REGULAR TOOTHPASTE.   Please read over the following fact sheets that you were given.

## 2018-05-12 ENCOUNTER — Encounter (HOSPITAL_COMMUNITY): Payer: Self-pay

## 2018-05-12 ENCOUNTER — Encounter (HOSPITAL_COMMUNITY)
Admission: RE | Admit: 2018-05-12 | Discharge: 2018-05-12 | Disposition: A | Discharge status: Home / Self Care | Payer: BLUE CROSS/BLUE SHIELD | Source: Ambulatory Visit | Attending: Cardiothoracic Surgery | Admitting: Cardiothoracic Surgery

## 2018-05-12 ENCOUNTER — Ambulatory Visit (HOSPITAL_COMMUNITY)
Admission: RE | Admit: 2018-05-12 | Discharge: 2018-05-12 | Disposition: A | Discharge status: Home / Self Care | Payer: BLUE CROSS/BLUE SHIELD | Source: Ambulatory Visit | Attending: Cardiothoracic Surgery | Admitting: Cardiothoracic Surgery

## 2018-05-12 ENCOUNTER — Other Ambulatory Visit: Payer: Self-pay

## 2018-05-12 DIAGNOSIS — Z01818 Encounter for other preprocedural examination: Secondary | ICD-10-CM | POA: Diagnosis not present

## 2018-05-12 DIAGNOSIS — Z01812 Encounter for preprocedural laboratory examination: Secondary | ICD-10-CM | POA: Insufficient documentation

## 2018-05-12 DIAGNOSIS — R911 Solitary pulmonary nodule: Secondary | ICD-10-CM | POA: Insufficient documentation

## 2018-05-12 DIAGNOSIS — Z01810 Encounter for preprocedural cardiovascular examination: Secondary | ICD-10-CM | POA: Insufficient documentation

## 2018-05-12 HISTORY — DX: Cardiac murmur, unspecified: R01.1

## 2018-05-12 HISTORY — DX: Other complications of anesthesia, initial encounter: T88.59XA

## 2018-05-12 HISTORY — DX: Adverse effect of unspecified anesthetic, initial encounter: T41.45XA

## 2018-05-12 HISTORY — DX: Solitary pulmonary nodule: R91.1

## 2018-05-12 LAB — SURGICAL PCR SCREEN
MRSA, PCR: NEGATIVE
Staphylococcus aureus: POSITIVE — AB

## 2018-05-12 LAB — COMPREHENSIVE METABOLIC PANEL
ALT: 22 U/L (ref 0–44)
AST: 25 U/L (ref 15–41)
Albumin: 4.2 g/dL (ref 3.5–5.0)
Alkaline Phosphatase: 94 U/L (ref 38–126)
Anion gap: 9 (ref 5–15)
BUN: 19 mg/dL (ref 6–20)
CO2: 30 mmol/L (ref 22–32)
Calcium: 9.8 mg/dL (ref 8.9–10.3)
Chloride: 98 mmol/L (ref 98–111)
Creatinine, Ser: 0.98 mg/dL (ref 0.44–1.00)
GFR calc Af Amer: >60 mL/min (ref >60)
GFR calc non Af Amer: >60 mL/min (ref >60)
Glucose, Bld: 89 mg/dL (ref 70–99)
Potassium: 3.8 mmol/L (ref 3.5–5.1)
Sodium: 137 mmol/L (ref 135–145)
Total Bilirubin: 0.6 mg/dL (ref 0.3–1.2)
Total Protein: 7.5 g/dL (ref 6.5–8.1)

## 2018-05-12 LAB — CBC
HCT: 37.9 % (ref 36.0–46.0)
Hemoglobin: 12.2 g/dL (ref 12.0–15.0)
MCH: 31.2 pg (ref 26.0–34.0)
MCHC: 32.2 g/dL (ref 30.0–36.0)
MCV: 96.9 fL (ref 78.0–100.0)
Platelets: 423 10*3/uL — ABNORMAL HIGH (ref 150–400)
RBC: 3.91 MIL/uL (ref 3.87–5.11)
RDW: 13.1 % (ref 11.5–15.5)
WBC: 9.8 10*3/uL (ref 4.0–10.5)

## 2018-05-12 LAB — URINALYSIS, ROUTINE W REFLEX MICROSCOPIC
Bilirubin Urine: NEGATIVE
Glucose, UA: NEGATIVE mg/dL
Ketones, ur: NEGATIVE mg/dL
Leukocytes, UA: NEGATIVE
Nitrite: NEGATIVE
Protein, ur: NEGATIVE mg/dL
Specific Gravity, Urine: 1.017 (ref 1.005–1.030)
pH: 6 (ref 5.0–8.0)

## 2018-05-12 LAB — PROTIME-INR
INR: 0.96
Prothrombin Time: 12.7 seconds (ref 11.4–15.2)

## 2018-05-12 LAB — APTT: aPTT: 37 seconds — ABNORMAL HIGH (ref 24–36)

## 2018-05-12 LAB — ABO/RH: ABO/RH(D): O POS

## 2018-05-12 NOTE — Progress Notes (Signed)
PCP - [facility]  Cardiologist - Denies  Chest x-ray - 05/12/18  EKG - 05/12/18  Stress Test - Denies  ECHO - 04/20/18 (E)  Cardiac Cath - Denies  Sleep Study - Denies CPAP - None  LABS- 05/12/18: CBC, CMP, PT, PTT, T/S, UA, PCR 05/15/18: ABG- Pt unable to tolerate in PAT  ASA- Denies  Anesthesia- Yes- EKG  Pt denies having chest pain, sob, or fever at this time. All instructions explained to the pt, with a verbal understanding of the material. Pt agrees to go over the instructions while at home for a better understanding. The opportunity to ask questions was provided.

## 2018-05-14 NOTE — Anesthesia Preprocedure Evaluation (Addendum)
Anesthesia Evaluation  Patient identified by MRN, date of birth, ID band Patient awake    Reviewed: Allergy & Precautions, NPO status , Patient's Chart, lab work & pertinent test results  History of Anesthesia Complications (+) PROLONGED EMERGENCE and history of anesthetic complications  Airway Mallampati: II  TM Distance: >3 FB Neck ROM: Full    Dental  (+) Dental Advisory Given, Poor Dentition, Missing, Chipped   Pulmonary asthma , COPD, Current Smoker,    breath sounds clear to auscultation       Cardiovascular hypertension, Pt. on medications + Valvular Problems/Murmurs AI  Rhythm:Regular Rate:Normal + Systolic murmurs  '19 TTE - mild LVH. EF 60% to 65%. Grade 1 diastolic dysfunction. Mild-mod AI. Trivial MR and TR. PASP: 24 mm Hg    Neuro/Psych Anxiety Depression negative neurological ROS     GI/Hepatic GERD  Medicated and Controlled,(+)     substance abuse  marijuana use,   Endo/Other  negative endocrine ROS  Renal/GU negative Renal ROS  negative genitourinary   Musculoskeletal negative musculoskeletal ROS (+)   Abdominal   Peds  Hematology negative hematology ROS (+)   Anesthesia Other Findings   Reproductive/Obstetrics                            Anesthesia Physical Anesthesia Plan  ASA: III  Anesthesia Plan: General   Post-op Pain Management:    Induction: Intravenous  PONV Risk Score and Plan: 3 and Treatment may vary due to age or medical condition, Ondansetron, Dexamethasone and Midazolam  Airway Management Planned: Double Lumen EBT  Additional Equipment: Arterial line, CVP and Ultrasound Guidance Line Placement  Intra-op Plan:   Post-operative Plan: Possible Post-op intubation/ventilation  Informed Consent: I have reviewed the patients History and Physical, chart, labs and discussed the procedure including the risks, benefits and alternatives for the proposed  anesthesia with the patient or authorized representative who has indicated his/her understanding and acceptance.   Dental advisory given  Plan Discussed with: CRNA and Anesthesiologist  Anesthesia Plan Comments:        Anesthesia Quick Evaluation

## 2018-05-15 ENCOUNTER — Inpatient Hospital Stay (HOSPITAL_COMMUNITY): Payer: BLUE CROSS/BLUE SHIELD

## 2018-05-15 ENCOUNTER — Inpatient Hospital Stay (HOSPITAL_COMMUNITY)
Admission: RE | Admit: 2018-05-15 | Discharge: 2018-05-19 | DRG: 165 | Disposition: A | Discharge status: Home / Self Care | Payer: BLUE CROSS/BLUE SHIELD | Attending: Cardiothoracic Surgery | Admitting: Cardiothoracic Surgery

## 2018-05-15 ENCOUNTER — Inpatient Hospital Stay (HOSPITAL_COMMUNITY): Payer: BLUE CROSS/BLUE SHIELD | Admitting: Certified Registered Nurse Anesthetist

## 2018-05-15 ENCOUNTER — Encounter (HOSPITAL_COMMUNITY)
Admission: RE | Disposition: A | Discharge status: Home / Self Care | Payer: Self-pay | Source: Ambulatory Visit | Attending: Cardiothoracic Surgery

## 2018-05-15 DIAGNOSIS — Z79899 Other long term (current) drug therapy: Secondary | ICD-10-CM

## 2018-05-15 DIAGNOSIS — D72829 Elevated white blood cell count, unspecified: Secondary | ICD-10-CM | POA: Diagnosis not present

## 2018-05-15 DIAGNOSIS — Z9889 Other specified postprocedural states: Secondary | ICD-10-CM

## 2018-05-15 DIAGNOSIS — K219 Gastro-esophageal reflux disease without esophagitis: Secondary | ICD-10-CM | POA: Diagnosis present

## 2018-05-15 DIAGNOSIS — J449 Chronic obstructive pulmonary disease, unspecified: Secondary | ICD-10-CM | POA: Diagnosis present

## 2018-05-15 DIAGNOSIS — J9383 Other pneumothorax: Secondary | ICD-10-CM | POA: Diagnosis not present

## 2018-05-15 DIAGNOSIS — R911 Solitary pulmonary nodule: Principal | ICD-10-CM | POA: Diagnosis present

## 2018-05-15 DIAGNOSIS — I10 Essential (primary) hypertension: Secondary | ICD-10-CM | POA: Diagnosis present

## 2018-05-15 DIAGNOSIS — J939 Pneumothorax, unspecified: Secondary | ICD-10-CM

## 2018-05-15 DIAGNOSIS — B965 Pseudomonas (aeruginosa) (mallei) (pseudomallei) as the cause of diseases classified elsewhere: Secondary | ICD-10-CM | POA: Diagnosis present

## 2018-05-15 DIAGNOSIS — J90 Pleural effusion, not elsewhere classified: Secondary | ICD-10-CM | POA: Diagnosis not present

## 2018-05-15 DIAGNOSIS — Z87891 Personal history of nicotine dependence: Secondary | ICD-10-CM | POA: Diagnosis not present

## 2018-05-15 DIAGNOSIS — Z09 Encounter for follow-up examination after completed treatment for conditions other than malignant neoplasm: Secondary | ICD-10-CM

## 2018-05-15 HISTORY — PX: THORACOTOMY: SHX5074

## 2018-05-15 HISTORY — PX: WEDGE RESECTION: SHX5070

## 2018-05-15 HISTORY — PX: VIDEO ASSISTED THORACOSCOPY (VATS)/WEDGE RESECTION: SHX6174

## 2018-05-15 LAB — BLOOD GAS, ARTERIAL
Acid-Base Excess: 2.6 mmol/L — ABNORMAL HIGH (ref 0.0–2.0)
Acid-Base Excess: 2.3 mmol/L — ABNORMAL HIGH (ref 0.0–2.0)
Bicarbonate: 27.5 mmol/L (ref 20.0–28.0)
Bicarbonate: 27.1 mmol/L (ref 20.0–28.0)
Drawn by: 470591
FIO2: 21
O2 Content: 2 L/min
O2 SAT: 96.3 %
O2 Saturation: 98 %
PCO2 ART: 48.5 mmHg — AB (ref 32.0–48.0)
PH ART: 7.367 (ref 7.350–7.450)
PO2 ART: 90.3 mmHg (ref 83.0–108.0)
Patient temperature: 98.6
Patient temperature: 98.6
pCO2 arterial: 49.5 mmHg — ABNORMAL HIGH (ref 32.0–48.0)
pH, Arterial: 7.363 (ref 7.350–7.450)
pO2, Arterial: 119 mmHg — ABNORMAL HIGH (ref 83.0–108.0)

## 2018-05-15 LAB — GLUCOSE, CAPILLARY
Glucose-Capillary: 123 mg/dL — ABNORMAL HIGH (ref 70–99)
Glucose-Capillary: 127 mg/dL — ABNORMAL HIGH (ref 70–99)

## 2018-05-15 LAB — PREPARE RBC (CROSSMATCH)

## 2018-05-15 SURGERY — VIDEO ASSISTED THORACOSCOPY (VATS)/WEDGE RESECTION
Anesthesia: General | Site: Chest | Laterality: Right

## 2018-05-15 MED ORDER — ACETAMINOPHEN 500 MG PO TABS
1000.0000 mg | ORAL_TABLET | Freq: Four times a day (QID) | ORAL | Status: DC
  Administered 2018-05-15 – 2018-05-18 (×12): 1000 mg via ORAL
  Filled 2018-05-15 (×12): qty 2

## 2018-05-15 MED ORDER — FENTANYL CITRATE (PF) 100 MCG/2ML IJ SOLN
INTRAMUSCULAR | Status: DC | PRN
  Administered 2018-05-15: 50 ug via INTRAVENOUS
  Administered 2018-05-15: 100 ug via INTRAVENOUS
  Administered 2018-05-15 (×4): 50 ug via INTRAVENOUS
  Administered 2018-05-15: 100 ug via INTRAVENOUS

## 2018-05-15 MED ORDER — DIPHENHYDRAMINE HCL 50 MG/ML IJ SOLN: 12.5000 mg | Freq: Four times a day (QID) | INTRAMUSCULAR | Status: DC | PRN

## 2018-05-15 MED ORDER — ROCURONIUM BROMIDE 10 MG/ML (PF) SYRINGE
PREFILLED_SYRINGE | INTRAVENOUS | Status: DC | PRN
  Administered 2018-05-15 (×2): 10 mg via INTRAVENOUS
  Administered 2018-05-15: 50 mg via INTRAVENOUS

## 2018-05-15 MED ORDER — BUPIVACAINE HCL (PF) 0.5 % IJ SOLN
INTRAMUSCULAR | Status: DC | PRN
  Administered 2018-05-15: 10 mL

## 2018-05-15 MED ORDER — LIDOCAINE 2% (20 MG/ML) 5 ML SYRINGE
INTRAMUSCULAR | Status: DC | PRN
  Administered 2018-05-15: 40 mg via INTRAVENOUS

## 2018-05-15 MED ORDER — BISACODYL 5 MG PO TBEC
10.0000 mg | DELAYED_RELEASE_TABLET | Freq: Every day | ORAL | Status: DC
  Administered 2018-05-16 – 2018-05-18 (×3): 10 mg via ORAL
  Filled 2018-05-15 (×4): qty 2

## 2018-05-15 MED ORDER — SENNOSIDES-DOCUSATE SODIUM 8.6-50 MG PO TABS
1.0000 | ORAL_TABLET | Freq: Every day | ORAL | Status: DC
  Administered 2018-05-15 – 2018-05-18 (×4): 1 via ORAL
  Filled 2018-05-15 (×4): qty 1

## 2018-05-15 MED ORDER — PHENYLEPHRINE 40 MCG/ML (10ML) SYRINGE FOR IV PUSH (FOR BLOOD PRESSURE SUPPORT)
PREFILLED_SYRINGE | INTRAVENOUS | Status: DC | PRN
  Administered 2018-05-15: 120 ug via INTRAVENOUS

## 2018-05-15 MED ORDER — POTASSIUM CHLORIDE 10 MEQ/50ML IV SOLN: 10.0000 meq | Freq: Every day | INTRAVENOUS | Status: DC | PRN

## 2018-05-15 MED ORDER — HEMOSTATIC AGENTS (NO CHARGE) OPTIME
TOPICAL | Status: DC | PRN
  Administered 2018-05-15 (×2): 1 via TOPICAL

## 2018-05-15 MED ORDER — SUGAMMADEX SODIUM 200 MG/2ML IV SOLN
INTRAVENOUS | Status: DC | PRN
  Administered 2018-05-15: 175 mg via INTRAVENOUS

## 2018-05-15 MED ORDER — POTASSIUM CHLORIDE IN NACL 20-0.9 MEQ/L-% IV SOLN
INTRAVENOUS | Status: DC
  Administered 2018-05-15 (×2): via INTRAVENOUS
  Filled 2018-05-15 (×3): qty 1000

## 2018-05-15 MED ORDER — DIPHENHYDRAMINE HCL 12.5 MG/5ML PO ELIX
12.5000 mg | ORAL_SOLUTION | Freq: Four times a day (QID) | ORAL | Status: DC | PRN
  Filled 2018-05-15: qty 5

## 2018-05-15 MED ORDER — GABAPENTIN 100 MG PO CAPS
100.0000 mg | ORAL_CAPSULE | Freq: Three times a day (TID) | ORAL | Status: DC
  Administered 2018-05-16 – 2018-05-19 (×10): 100 mg via ORAL
  Filled 2018-05-15 (×10): qty 1

## 2018-05-15 MED ORDER — BUPIVACAINE HCL (PF) 0.5 % IJ SOLN
INTRAMUSCULAR | Status: AC
  Filled 2018-05-15: qty 10

## 2018-05-15 MED ORDER — OXYCODONE HCL 5 MG/5ML PO SOLN: 5.0000 mg | Freq: Once | ORAL | Status: DC | PRN

## 2018-05-15 MED ORDER — DULOXETINE HCL 60 MG PO CPEP
60.0000 mg | ORAL_CAPSULE | Freq: Every day | ORAL | Status: DC
  Administered 2018-05-16 – 2018-05-19 (×4): 60 mg via ORAL
  Filled 2018-05-15 (×4): qty 1

## 2018-05-15 MED ORDER — BUPIVACAINE ON-Q PAIN PUMP (FOR ORDER SET NO CHG)
INJECTION | Status: AC
  Filled 2018-05-15: qty 1

## 2018-05-15 MED ORDER — METOPROLOL TARTRATE 25 MG PO TABS
25.0000 mg | ORAL_TABLET | Freq: Two times a day (BID) | ORAL | Status: DC
  Administered 2018-05-15 – 2018-05-19 (×9): 25 mg via ORAL
  Filled 2018-05-15 (×9): qty 1

## 2018-05-15 MED ORDER — ONDANSETRON HCL 4 MG/2ML IJ SOLN
INTRAMUSCULAR | Status: DC | PRN
  Administered 2018-05-15: 4 mg via INTRAVENOUS

## 2018-05-15 MED ORDER — HYDRALAZINE HCL 20 MG/ML IJ SOLN
INTRAMUSCULAR | Status: AC
  Administered 2018-05-15: 20 mg
  Filled 2018-05-15: qty 1

## 2018-05-15 MED ORDER — BUPIVACAINE 0.5 % ON-Q PUMP SINGLE CATH 400 ML
400.0000 mL | INJECTION | Status: DC
  Filled 2018-05-15: qty 400

## 2018-05-15 MED ORDER — LABETALOL HCL 5 MG/ML IV SOLN
20.0000 mg | INTRAVENOUS | Status: DC | PRN
  Administered 2018-05-15 – 2018-05-16 (×6): 20 mg via INTRAVENOUS
  Filled 2018-05-15 (×6): qty 4

## 2018-05-15 MED ORDER — OXYCODONE HCL 5 MG PO TABS: 5.0000 mg | ORAL_TABLET | Freq: Once | ORAL | Status: DC | PRN

## 2018-05-15 MED ORDER — ADULT MULTIVITAMIN W/MINERALS CH
1.0000 | ORAL_TABLET | Freq: Every day | ORAL | Status: DC
  Administered 2018-05-16 – 2018-05-19 (×4): 1 via ORAL
  Filled 2018-05-15 (×4): qty 1

## 2018-05-15 MED ORDER — PROPOFOL 10 MG/ML IV BOLUS
INTRAVENOUS | Status: AC
  Filled 2018-05-15: qty 20

## 2018-05-15 MED ORDER — FENTANYL CITRATE (PF) 250 MCG/5ML IJ SOLN
INTRAMUSCULAR | Status: AC
Start: 2018-05-15 — End: ?
  Filled 2018-05-15: qty 5

## 2018-05-15 MED ORDER — FENTANYL CITRATE (PF) 100 MCG/2ML IJ SOLN
INTRAMUSCULAR | Status: AC
  Administered 2018-05-15: 100 ug
  Filled 2018-05-15: qty 2

## 2018-05-15 MED ORDER — MUPIROCIN 2 % EX OINT
1.0000 "application " | TOPICAL_OINTMENT | Freq: Once | CUTANEOUS | Status: AC
  Administered 2018-05-15: 1 via TOPICAL

## 2018-05-15 MED ORDER — ESMOLOL HCL 100 MG/10ML IV SOLN
INTRAVENOUS | Status: DC | PRN
  Administered 2018-05-15 (×2): 10 mg via INTRAVENOUS
  Administered 2018-05-15: 20 mg via INTRAVENOUS

## 2018-05-15 MED ORDER — SODIUM CHLORIDE 0.9% FLUSH
10.0000 mL | Freq: Two times a day (BID) | INTRAVENOUS | Status: DC
  Administered 2018-05-15 – 2018-05-19 (×8): 10 mL

## 2018-05-15 MED ORDER — NALOXONE HCL 0.4 MG/ML IJ SOLN: 0.4000 mg | INTRAMUSCULAR | Status: DC | PRN

## 2018-05-15 MED ORDER — LACTATED RINGERS IV SOLN
INTRAVENOUS | Status: DC | PRN
  Administered 2018-05-15 (×2): via INTRAVENOUS

## 2018-05-15 MED ORDER — FENTANYL CITRATE (PF) 100 MCG/2ML IJ SOLN
25.0000 ug | INTRAMUSCULAR | Status: DC | PRN
  Administered 2018-05-15 (×2): 25 ug via INTRAVENOUS

## 2018-05-15 MED ORDER — OXYCODONE HCL 5 MG PO TABS
5.0000 mg | ORAL_TABLET | ORAL | Status: DC | PRN
  Administered 2018-05-15: 5 mg via ORAL
  Administered 2018-05-16 – 2018-05-19 (×3): 10 mg via ORAL
  Filled 2018-05-15: qty 2
  Filled 2018-05-15: qty 1
  Filled 2018-05-15 (×2): qty 2

## 2018-05-15 MED ORDER — PROPOFOL 10 MG/ML IV BOLUS
INTRAVENOUS | Status: DC | PRN
  Administered 2018-05-15: 140 mg via INTRAVENOUS
  Administered 2018-05-15: 40 mg via INTRAVENOUS

## 2018-05-15 MED ORDER — DEXAMETHASONE SODIUM PHOSPHATE 10 MG/ML IJ SOLN
INTRAMUSCULAR | Status: DC | PRN
  Administered 2018-05-15: 10 mg via INTRAVENOUS

## 2018-05-15 MED ORDER — MUPIROCIN 2 % EX OINT
TOPICAL_OINTMENT | CUTANEOUS | Status: AC
  Filled 2018-05-15: qty 22

## 2018-05-15 MED ORDER — LISINOPRIL 20 MG PO TABS
20.0000 mg | ORAL_TABLET | Freq: Every day | ORAL | Status: DC
  Administered 2018-05-16 – 2018-05-19 (×4): 20 mg via ORAL
  Filled 2018-05-15 (×5): qty 1

## 2018-05-15 MED ORDER — SODIUM CHLORIDE 0.9% FLUSH: 10.0000 mL | INTRAVENOUS | Status: DC | PRN

## 2018-05-15 MED ORDER — PROPOFOL 10 MG/ML IV BOLUS
INTRAVENOUS | Status: AC
Start: 2018-05-15 — End: ?
  Filled 2018-05-15: qty 20

## 2018-05-15 MED ORDER — ONDANSETRON HCL 4 MG/2ML IJ SOLN: 4.0000 mg | Freq: Four times a day (QID) | INTRAMUSCULAR | Status: DC | PRN

## 2018-05-15 MED ORDER — SODIUM CHLORIDE 0.9% FLUSH: 9.0000 mL | INTRAVENOUS | Status: DC | PRN

## 2018-05-15 MED ORDER — PRAVASTATIN SODIUM 40 MG PO TABS
40.0000 mg | ORAL_TABLET | Freq: Every day | ORAL | Status: DC
  Administered 2018-05-15 – 2018-05-18 (×4): 40 mg via ORAL
  Filled 2018-05-15 (×4): qty 1

## 2018-05-15 MED ORDER — HYDRALAZINE HCL 20 MG/ML IJ SOLN
10.0000 mg | Freq: Once | INTRAMUSCULAR | Status: AC
  Administered 2018-05-15: 10 mg via INTRAVENOUS

## 2018-05-15 MED ORDER — ONDANSETRON HCL 4 MG/2ML IJ SOLN
4.0000 mg | Freq: Four times a day (QID) | INTRAMUSCULAR | Status: DC | PRN
  Administered 2018-05-15: 4 mg via INTRAVENOUS
  Filled 2018-05-15: qty 2

## 2018-05-15 MED ORDER — PROMETHAZINE HCL 25 MG/ML IJ SOLN: 6.2500 mg | INTRAMUSCULAR | Status: DC | PRN

## 2018-05-15 MED ORDER — FAMOTIDINE 20 MG PO TABS
20.0000 mg | ORAL_TABLET | Freq: Two times a day (BID) | ORAL | Status: DC
  Administered 2018-05-15 – 2018-05-19 (×8): 20 mg via ORAL
  Filled 2018-05-15 (×8): qty 1

## 2018-05-15 MED ORDER — 0.9 % SODIUM CHLORIDE (POUR BTL) OPTIME
TOPICAL | Status: DC | PRN
  Administered 2018-05-15: 2000 mL

## 2018-05-15 MED ORDER — BUPIVACAINE 0.5 % ON-Q PUMP SINGLE CATH 400 ML
INJECTION | Status: AC | PRN
  Administered 2018-05-15: 400 mL

## 2018-05-15 MED ORDER — MIDAZOLAM HCL 5 MG/5ML IJ SOLN
INTRAMUSCULAR | Status: DC | PRN
  Administered 2018-05-15: 2 mg via INTRAVENOUS

## 2018-05-15 MED ORDER — MELOXICAM 7.5 MG PO TABS
7.5000 mg | ORAL_TABLET | Freq: Every day | ORAL | Status: DC
  Administered 2018-05-16 – 2018-05-19 (×4): 7.5 mg via ORAL
  Filled 2018-05-15 (×4): qty 1

## 2018-05-15 MED ORDER — TRAMADOL HCL 50 MG PO TABS
50.0000 mg | ORAL_TABLET | Freq: Four times a day (QID) | ORAL | Status: DC | PRN
  Administered 2018-05-18: 100 mg via ORAL
  Filled 2018-05-15: qty 2

## 2018-05-15 MED ORDER — SODIUM CHLORIDE 0.9 % IV SOLN
INTRAVENOUS | Status: DC
  Administered 2018-05-15 (×2): via INTRAVENOUS

## 2018-05-15 MED ORDER — ACETAMINOPHEN 160 MG/5ML PO SOLN
1000.0000 mg | Freq: Four times a day (QID) | ORAL | Status: DC
  Filled 2018-05-15: qty 40.6

## 2018-05-15 MED ORDER — INSULIN ASPART 100 UNIT/ML ~~LOC~~ SOLN
0.0000 [IU] | Freq: Four times a day (QID) | SUBCUTANEOUS | Status: DC
  Administered 2018-05-15: 2 [IU] via SUBCUTANEOUS

## 2018-05-15 MED ORDER — FENTANYL CITRATE (PF) 250 MCG/5ML IJ SOLN
INTRAMUSCULAR | Status: AC
  Filled 2018-05-15: qty 5

## 2018-05-15 MED ORDER — CEFAZOLIN SODIUM-DEXTROSE 2-4 GM/100ML-% IV SOLN
2.0000 g | Freq: Three times a day (TID) | INTRAVENOUS | Status: AC
Start: 2018-05-15 — End: 2018-05-16
  Administered 2018-05-15 (×2): 2 g via INTRAVENOUS
  Filled 2018-05-15 (×2): qty 100

## 2018-05-15 MED ORDER — MIDAZOLAM HCL 2 MG/2ML IJ SOLN
INTRAMUSCULAR | Status: AC
  Filled 2018-05-15: qty 2

## 2018-05-15 MED ORDER — AMLODIPINE BESYLATE 10 MG PO TABS
10.0000 mg | ORAL_TABLET | Freq: Every day | ORAL | Status: DC
  Administered 2018-05-15 – 2018-05-19 (×5): 10 mg via ORAL
  Filled 2018-05-15 (×5): qty 1

## 2018-05-15 MED ORDER — CEFAZOLIN SODIUM-DEXTROSE 2-4 GM/100ML-% IV SOLN
2.0000 g | INTRAVENOUS | Status: AC
  Administered 2018-05-15: 2 g via INTRAVENOUS
  Filled 2018-05-15 (×2): qty 100

## 2018-05-15 MED ORDER — LISINOPRIL 10 MG PO TABS: 10.0000 mg | ORAL_TABLET | Freq: Every day | ORAL | Status: DC

## 2018-05-15 MED ORDER — FENTANYL CITRATE (PF) 100 MCG/2ML IJ SOLN
INTRAMUSCULAR | Status: AC
  Filled 2018-05-15: qty 2

## 2018-05-15 MED ORDER — FENTANYL 40 MCG/ML IV SOLN
INTRAVENOUS | Status: DC
  Administered 2018-05-15: 100 ug via INTRAVENOUS
  Administered 2018-05-15: 1000 ug via INTRAVENOUS
  Administered 2018-05-15: 60 ug via INTRAVENOUS
  Administered 2018-05-15: 70 ug via INTRAVENOUS
  Administered 2018-05-16: 90 ug via INTRAVENOUS
  Administered 2018-05-16: 20 ug via INTRAVENOUS
  Administered 2018-05-16 (×2): 30 ug via INTRAVENOUS
  Filled 2018-05-15 (×2): qty 25

## 2018-05-15 MED ORDER — HYDRALAZINE HCL 20 MG/ML IJ SOLN
INTRAMUSCULAR | Status: AC
  Filled 2018-05-15: qty 1

## 2018-05-15 SURGICAL SUPPLY — 68 items
APPLICATOR TIP COSEAL (VASCULAR PRODUCTS) ×3 IMPLANT
APPLICATOR TIP EXT COSEAL (VASCULAR PRODUCTS) ×6 IMPLANT
BIT DRILL 7/64X5 DISP (BIT) ×3 IMPLANT
CANISTER SUCT 3000ML PPV (MISCELLANEOUS) ×3 IMPLANT
CATH KIT ON Q 5IN SLV (PAIN MANAGEMENT) ×3 IMPLANT
CATH KIT ON-Q SILVERSOAK 5IN (CATHETERS) ×6 IMPLANT
CATH THORACIC 28FR (CATHETERS) ×3 IMPLANT
CLIP VESOCCLUDE MED 6/CT (CLIP) ×3 IMPLANT
CONT SPEC 4OZ CLIKSEAL STRL BL (MISCELLANEOUS) ×12 IMPLANT
CUTTER ECHEON FLEX ENDO 45 340 (ENDOMECHANICALS) ×3 IMPLANT
DERMABOND ADVANCED (GAUZE/BANDAGES/DRESSINGS) ×2
DERMABOND ADVANCED .7 DNX12 (GAUZE/BANDAGES/DRESSINGS) ×1 IMPLANT
DRAPE LAPAROSCOPIC ABDOMINAL (DRAPES) ×3 IMPLANT
DRAPE WARM FLUID 44X44 (DRAPE) ×3 IMPLANT
ELECT BLADE 4.0 EZ CLEAN MEGAD (MISCELLANEOUS) ×3
ELECT BLADE 6.5 EXT (BLADE) ×3 IMPLANT
ELECT REM PT RETURN 9FT ADLT (ELECTROSURGICAL) ×3
ELECTRODE BLDE 4.0 EZ CLN MEGD (MISCELLANEOUS) ×1 IMPLANT
ELECTRODE REM PT RTRN 9FT ADLT (ELECTROSURGICAL) ×1 IMPLANT
GAUZE SPONGE 4X4 12PLY STRL (GAUZE/BANDAGES/DRESSINGS) ×3 IMPLANT
GOWN STRL REUS W/ TWL LRG LVL3 (GOWN DISPOSABLE) ×4 IMPLANT
GOWN STRL REUS W/TWL LRG LVL3 (GOWN DISPOSABLE) ×8
KIT BASIN OR (CUSTOM PROCEDURE TRAY) ×3 IMPLANT
KIT TURNOVER KIT B (KITS) ×3 IMPLANT
NS IRRIG 1000ML POUR BTL (IV SOLUTION) ×12 IMPLANT
PACK CHEST (CUSTOM PROCEDURE TRAY) ×3 IMPLANT
PAD ARMBOARD 7.5X6 YLW CONV (MISCELLANEOUS) ×6 IMPLANT
PASSER SUT SWANSON 36MM LOOP (INSTRUMENTS) ×3 IMPLANT
SEALANT SURG COSEAL 4ML (VASCULAR PRODUCTS) ×3 IMPLANT
SEALANT SURG COSEAL 8ML (VASCULAR PRODUCTS) ×3 IMPLANT
SOLUTION ANTI FOG 6CC (MISCELLANEOUS) ×3 IMPLANT
SPONGE TONSIL TAPE 1 RFD (DISPOSABLE) ×12 IMPLANT
STAPLE RELOAD 45MM GOLD (STAPLE) ×9 IMPLANT
SUT PROLENE 3 0 SH DA (SUTURE) IMPLANT
SUT PROLENE 4 0 RB 1 (SUTURE)
SUT PROLENE 4-0 RB1 .5 CRCL 36 (SUTURE) IMPLANT
SUT PROLENE 6 0 C 1 30 (SUTURE) IMPLANT
SUT SILK  1 MH (SUTURE) ×4
SUT SILK 1 MH (SUTURE) ×2 IMPLANT
SUT SILK 1 TIES 10X30 (SUTURE) IMPLANT
SUT SILK 2 0SH CR/8 30 (SUTURE) IMPLANT
SUT SILK 3 0SH CR/8 30 (SUTURE) ×3 IMPLANT
SUT VIC AB 0 CT1 27 (SUTURE) ×2
SUT VIC AB 0 CT1 27XBRD ANBCTR (SUTURE) ×1 IMPLANT
SUT VIC AB 1 CTX 18 (SUTURE) ×6 IMPLANT
SUT VIC AB 1 CTX 36 (SUTURE)
SUT VIC AB 1 CTX36XBRD ANBCTR (SUTURE) IMPLANT
SUT VIC AB 2-0 CT1 27 (SUTURE) ×2
SUT VIC AB 2-0 CT1 TAPERPNT 27 (SUTURE) ×1 IMPLANT
SUT VIC AB 2-0 CT2 18 VCP726D (SUTURE) ×3 IMPLANT
SUT VIC AB 2-0 CTX 36 (SUTURE) IMPLANT
SUT VIC AB 2-0 UR6 27 (SUTURE) ×6 IMPLANT
SUT VIC AB 3-0 SH 18 (SUTURE) IMPLANT
SUT VIC AB 3-0 SH 8-18 (SUTURE) IMPLANT
SUT VIC AB 3-0 X1 27 (SUTURE) ×6 IMPLANT
SUT VICRYL 2 TP 1 (SUTURE) ×3 IMPLANT
SWAB COLLECTION DEVICE MRSA (MISCELLANEOUS) IMPLANT
SWAB CULTURE ESWAB REG 1ML (MISCELLANEOUS) IMPLANT
SYSTEM SAHARA CHEST DRAIN ATS (WOUND CARE) ×3 IMPLANT
TAPE CLOTH SURG 6X10 WHT LF (GAUZE/BANDAGES/DRESSINGS) ×3 IMPLANT
TIP APPLICATOR SPRAY EXTEND 16 (VASCULAR PRODUCTS) IMPLANT
TOWEL GREEN STERILE (TOWEL DISPOSABLE) ×6 IMPLANT
TOWEL GREEN STERILE FF (TOWEL DISPOSABLE) ×6 IMPLANT
TRAP SPECIMEN MUCOUS 40CC (MISCELLANEOUS) IMPLANT
TRAY FOLEY MTR SLVR 16FR STAT (SET/KITS/TRAYS/PACK) ×3 IMPLANT
TROCAR XCEL BLADELESS 5X75MML (TROCAR) ×3 IMPLANT
TUNNELER SHEATH ON-Q 11GX8 DSP (PAIN MANAGEMENT) ×3 IMPLANT
WATER STERILE IRR 1000ML POUR (IV SOLUTION) ×6 IMPLANT

## 2018-05-15 NOTE — Anesthesia Postprocedure Evaluation (Signed)
Anesthesia Post Note  Patient: Melissa Molina  Procedure(s) Performed: VIDEO ASSISTED THORACOSCOPY (VATS)/LUNG RESECTION (Right Chest) RIGHT UPPER LOBE WEDGE RESECTION WITH LYMPH NODE DISECTION (Right Chest) MINI/LIMITED THORACOTOMY RIGHT  (Right Chest)     Patient location during evaluation: PACU Anesthesia Type: General Level of consciousness: awake and alert Pain management: pain level controlled Vital Signs Assessment: post-procedure vital signs reviewed and stable Respiratory status: spontaneous breathing, nonlabored ventilation, respiratory function stable and patient connected to face mask oxygen Cardiovascular status: blood pressure returned to baseline and stable Postop Assessment: no apparent nausea or vomiting Anesthetic complications: no    Last Vitals:  Vitals:   05/15/18 1130 05/15/18 1131  BP:  (!) 161/87  Pulse: 86 86  Resp: (!) 22 (!) 24  Temp:    SpO2: 99% 99%    Last Pain:  Vitals:   05/15/18 1046  TempSrc:   PainSc: 10-Worst pain ever                 Audry Pili

## 2018-05-15 NOTE — Anesthesia Procedure Notes (Signed)
Arterial Line Insertion Start/End8/16/2019 7:10 AM, 05/15/2018 7:20 AM Performed by: Gaylene Brooks, CRNA, CRNA  Patient location: Pre-op. Preanesthetic checklist: patient identified, IV checked, site marked, risks and benefits discussed, surgical consent, monitors and equipment checked, pre-op evaluation, timeout performed and anesthesia consent Patient sedated Left, radial was placed Catheter size: 20 G Hand hygiene performed  and maximum sterile barriers used   Attempts: 1 Procedure performed without using ultrasound guided technique. Following insertion, Biopatch and dressing applied. Post procedure assessment: normal  Patient tolerated the procedure well with no immediate complications.

## 2018-05-15 NOTE — Anesthesia Procedure Notes (Signed)
Central Venous Catheter Insertion Performed by: Audry Pili, MD, anesthesiologist Start/End8/16/2019 7:14 AM, 05/15/2018 7:20 AM Patient location: Pre-op. Preanesthetic checklist: patient identified, IV checked, site marked, risks and benefits discussed, surgical consent, monitors and equipment checked, pre-op evaluation, timeout performed and anesthesia consent Lidocaine 1% used for infiltration and patient sedated Hand hygiene performed  and maximum sterile barriers used  Catheter size: 8 Fr Total catheter length 16. Central line was placed.Double lumen Procedure performed using ultrasound guided technique. Ultrasound Notes:anatomy identified, needle tip was noted to be adjacent to the nerve/plexus identified, no ultrasound evidence of intravascular and/or intraneural injection and image(s) printed for medical record Attempts: 2 (First attempt, unable to thread wire) Following insertion, dressing applied, line sutured and Biopatch. Post procedure assessment: blood return through all ports  Patient tolerated the procedure well with no immediate complications.

## 2018-05-15 NOTE — Anesthesia Procedure Notes (Signed)
Procedure Name: Intubation Date/Time: 05/15/2018 7:44 AM Performed by: Gaylene Brooks, CRNA Pre-anesthesia Checklist: Patient identified, Emergency Drugs available, Suction available and Patient being monitored Patient Re-evaluated:Patient Re-evaluated prior to induction Oxygen Delivery Method: Circle System Utilized Preoxygenation: Pre-oxygenation with 100% oxygen Induction Type: IV induction Ventilation: Mask ventilation without difficulty Laryngoscope Size: Miller and 2 Grade View: Grade II Tube type: Oral Endobronchial tube: Double lumen EBT, Left, EBT position confirmed by fiberoptic bronchoscope and EBT position confirmed by auscultation and 37 Fr Number of attempts: 1 Airway Equipment and Method: Stylet and Oral airway Placement Confirmation: ETT inserted through vocal cords under direct vision,  positive ETCO2 and breath sounds checked- equal and bilateral Tube secured with: Tape Dental Injury: Teeth and Oropharynx as per pre-operative assessment

## 2018-05-15 NOTE — Brief Op Note (Signed)
05/15/2018  10:06 AM  PATIENT:  Melissa Molina  57 y.o. female  PRE-OPERATIVE DIAGNOSIS:  RIGHT UPPER LOBE NODULE  POST-OPERATIVE DIAGNOSIS:  RIGHT UPPER LOBE NODULE  PROCEDURE: RIGHT VIDEO ASSISTED THORACOSCOPY (VATS), RIGHT MINI THORACOTOMY, RIGHT UPPER LOBE WEDGE RESECTION WITH LYMPH NODE DISSECTION, and On Q PLACEMENT  SURGEON:  Surgeon(s) and Role:    Ivin Poot, MD - Primary  PHYSICIAN ASSISTANT: Lars Pinks PA-C  ANESTHESIA:   general  EBL:  50 mL   BLOOD ADMINISTERED:none  DRAINS: Chest tube placed in the right pleural space   LOCAL MEDICATIONS USED:  BUPIVICAINE   SPECIMEN:  Source of Specimen:  RUL wedge resection, lymph node (4R)  DISPOSITION OF SPECIMEN:  Pathology and culture. Frozen section of RUL wedge showed inflammation NOT carcinoma  COUNTS CORRECT:  YES  DICTATION: .Dragon Dictation  PLAN OF CARE: Admit to inpatient   PATIENT DISPOSITION:  ICU - intubated and hemodynamically stable.   Delay start of Pharmacological VTE agent (>24hrs) due to surgical blood loss or risk of bleeding: yes

## 2018-05-15 NOTE — Transfer of Care (Signed)
Immediate Anesthesia Transfer of Care Note  Patient: Melissa Molina  Procedure(s) Performed: VIDEO ASSISTED THORACOSCOPY (VATS)/LUNG RESECTION (Right Chest) RIGHT UPPER LOBE WEDGE RESECTION WITH LYMPH NODE DISECTION (Right Chest) MINI/LIMITED THORACOTOMY RIGHT  (Right Chest)  Patient Location: PACU  Anesthesia Type:General  Level of Consciousness: awake, alert , oriented and drowsy  Airway & Oxygen Therapy: Patient Spontanous Breathing and Patient connected to face mask oxygen  Post-op Assessment: Report given to RN, Post -op Vital signs reviewed and stable and Patient moving all extremities X 4  Post vital signs: Reviewed and stable  Last Vitals:  Vitals Value Taken Time  BP 180/96 05/15/2018 10:31 AM  Temp    Pulse 81 05/15/2018 10:36 AM  Resp 23 05/15/2018 10:36 AM  SpO2 100 % 05/15/2018 10:36 AM  Vitals shown include unvalidated device data.  Last Pain:  Vitals:   05/15/18 0616  TempSrc:   PainSc: 0-No pain      Patients Stated Pain Goal: 3 (29/19/16 6060)  Complications: No apparent anesthesia complications

## 2018-05-15 NOTE — Progress Notes (Signed)
Paged Dr Darcey Nora concerning patients BP-- 190s/80s. MD to place orders. RN will continue to monitor.
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
## 2018-05-15 NOTE — Op Note (Signed)
NAME: Melissa Molina, Melissa Molina MEDICAL RECORD ZO:[id number] ACCOUNT [id number] DATE OF BIRTH:09/21/1961 FACILITY: [facility] LOCATION: [facility] PHYSICIAN:Marisabel Macpherson VAN TRIGT III, MD  OPERATIVE REPORT  DATE OF PROCEDURE:  05/15/2018  OPERATION:   1.  Right video-assisted thoracoscopic surgery, wedge resection of right upper lobe nodule.   2.  Mediastinal lymph node dissection. 3.  Placement of On-Q wound analgesia system.   SURGEON:  Ivin Poot, MD  ASSISTANT:  Lars Pinks, PA-C.  PREOPERATIVE DIAGNOSIS:  Right upper lobe lobulated nodular density increasing in size with mild activity on PET scan and history of heavy smoking.  POSTOPERATIVE DIAGNOSIS:  Right upper lobe lobulated nodular density increasing in size with mild activity on PET scan and history of heavy smoking.  CLINICAL NOTE:  The patient is a 57 year old female with heavy smoking history and recent diagnosis of a right upper lobe nodule showing some increase in size over time and with positive metabolic activity, although mild on PET scan.  A transthoracic  needle biopsy was nondiagnostic except for some necrotic material.  Thoracic surgical evaluation was requested and I recommended excisional biopsy of the nodule with a right VATS.  I discussed the procedure of right VATS with the patient in detail  including the use of general anesthesia, the location of the surgical incisions, the expected postoperative hospital recovery and the expected use of postoperative chest tube drainage.  I discussed with the patient the risks to her, the operation  including risks of bleeding, prolonged air leak, infection, and pulmonary problems including pneumonia.  After our conversation, she demonstrated her understanding and agreed to proceed with surgery under what I felt was an informed consent.  OPERATIVE FINDINGS: 1.  Heavy smoking changes in the right lung. 2.  Right upper lobe subpleural apical density with a rubbery consistency  completely excised with a generous wedge resection with frozen section showing inflammatory changes only.  No evidence of malignancy. 3.  Right mediastinal lymph node removed and sent for pathology and culture.  DESCRIPTION OF PROCEDURE:  The patient was brought to the operating room and placed supine on the operating table.  After the preoperative evaluation was completed, the proper site marked, and all final questions addressed to the patient.  The patient  was intubated and a double lumen tube was placed by the anesthesia team.  The patient was turned right side up.  The right chest was prepped and draped as a sterile field.  A proper time-out was performed.  A small incision was made below the tip of the  scapula in the seventh interspace.  A VATS camera was inserted.  The lung was poorly collapsed.  Suction was placed on the right lung endotracheal tube.  The apical density could not be visualized with the camera because of the expansion of the lung.  A small incision was made in the 4th interspace, anterior to the latissimus and the ribs gently spread.  The lung compression allowed visualization of the subpleural tumor in the right apex.  No other lung nodules were identified.  Using the VATS stapler device and 2 additional incisions anteriorly and posteriorly, the nodule was wedged off the right upper lobe.  The staple line was covered with a medical adhesive -- CoSeal.  This was sent for pathology.  The inferior ligament was  taken down to provide mobility of the lung to fill the space.  No other nodules were felt or seen.  The mediastinal node on the main stem bronchus and the azygous space was  then removed and sent for culture and pathology.  Bleeding was minimal and hemostasis was achieved.  Next, a 28-French chest tube was placed in the pleural space and directed to the apex and secured to the skin.  Next, the small incision was closed with interrupted #1 Vicryl around the ribs in  approximation.  The muscle was closed with interrupted 5-0 Vicryl and the skin closed with subcutaneous and subcuticular suture.  The VATS portal incisions were closed in layers using Vicryl.  An On-Q catheter was placed beneath the main incision extending from the chest tube site posteriorly in the chest wall for irrigation with 0.5% Marcaine.  The catheter was secured with a silk suture and connected to the reservoir.  The chest tube was connected to underwater seal Pleur-Evac drainage system.  The patient was turned supine and extubated and returned to recovery room in stable condition.  Prior to closing the chest, the pathology report returned on the nodule,  inflammatory changes, no malignancy.  TN/NUANCE  D:05/15/2018 T:05/15/2018 JOB:[id number]

## 2018-05-15 NOTE — Progress Notes (Signed)
Pre Procedure note for inpatients:   Melissa Molina has been scheduled for Procedure(s): VIDEO ASSISTED THORACOSCOPY (VATS)/LUNG RESECTION (Right) today. The various methods of treatment have been discussed with the patient. After consideration of the risks, benefits and treatment options the patient has consented to the planned procedure.   The patient has been seen and labs reviewed. There are no changes in the patient's condition to prevent proceeding with the planned procedure today.  Recent labs:  Lab Results  Component Value Date   WBC 9.8 05/12/2018   HGB 12.2 05/12/2018   HCT 37.9 05/12/2018   PLT 423 (H) 05/12/2018   GLUCOSE 89 05/12/2018   ALT 22 05/12/2018   AST 25 05/12/2018   NA 137 05/12/2018   K 3.8 05/12/2018   CL 98 05/12/2018   CREATININE 0.98 05/12/2018   BUN 19 05/12/2018   CO2 30 05/12/2018   INR 0.96 05/12/2018    Len Childs, MD 05/15/2018 7:06 AM

## 2018-05-16 ENCOUNTER — Inpatient Hospital Stay (HOSPITAL_COMMUNITY): Payer: BLUE CROSS/BLUE SHIELD

## 2018-05-16 ENCOUNTER — Other Ambulatory Visit: Payer: Self-pay

## 2018-05-16 ENCOUNTER — Encounter (HOSPITAL_COMMUNITY): Payer: Self-pay | Admitting: Cardiothoracic Surgery

## 2018-05-16 LAB — BASIC METABOLIC PANEL
Anion gap: 9 (ref 5–15)
BUN: 12 mg/dL (ref 6–20)
CO2: 25 mmol/L (ref 22–32)
Calcium: 8.7 mg/dL — ABNORMAL LOW (ref 8.9–10.3)
Chloride: 101 mmol/L (ref 98–111)
Creatinine, Ser: 0.68 mg/dL (ref 0.44–1.00)
GFR calc Af Amer: >60 mL/min (ref >60)
GFR calc non Af Amer: >60 mL/min (ref >60)
Glucose, Bld: 102 mg/dL — ABNORMAL HIGH (ref 70–99)
Potassium: 4.3 mmol/L (ref 3.5–5.1)
Sodium: 135 mmol/L (ref 135–145)

## 2018-05-16 LAB — ACID FAST SMEAR (AFB, MYCOBACTERIA)
Acid Fast Smear: NEGATIVE
Acid Fast Smear: NEGATIVE

## 2018-05-16 LAB — GLUCOSE, CAPILLARY
Glucose-Capillary: 103 mg/dL — ABNORMAL HIGH (ref 70–99)
Glucose-Capillary: 112 mg/dL — ABNORMAL HIGH (ref 70–99)

## 2018-05-16 LAB — CBC
HCT: 35.5 % — ABNORMAL LOW (ref 36.0–46.0)
Hemoglobin: 11.4 g/dL — ABNORMAL LOW (ref 12.0–15.0)
MCH: 31.1 pg (ref 26.0–34.0)
MCHC: 32.1 g/dL (ref 30.0–36.0)
MCV: 96.7 fL (ref 78.0–100.0)
Platelets: 390 10*3/uL (ref 150–400)
RBC: 3.67 MIL/uL — ABNORMAL LOW (ref 3.87–5.11)
RDW: 12.9 % (ref 11.5–15.5)
WBC: 14.8 10*3/uL — ABNORMAL HIGH (ref 4.0–10.5)

## 2018-05-16 MED ORDER — PIPERACILLIN-TAZOBACTAM 3.375 G IVPB
3.3750 g | Freq: Three times a day (TID) | INTRAVENOUS | Status: DC
Start: 2018-05-16 — End: 2018-05-18
  Administered 2018-05-16 – 2018-05-18 (×5): 3.375 g via INTRAVENOUS
  Filled 2018-05-16 (×6): qty 50

## 2018-05-16 NOTE — Progress Notes (Signed)
End of shift report:  Melissa Molina had a great day. In great spirits, eager to get up and move around. When asked always stated she had no pain. BP remained elevated at the beginning of the shift 0910 152/85 Map 103.  Scheduled BP medicine due at 1000 administered at 0830 and then at 0915 Labetolol 20 mg IV administered d/t a BP of 152/85 (103). Bp slowly came down to 120's/70's, however it began to increase around 1430 and eventually received a second dose of Labetalol at 1730 for a BP of 154/73 (103). Dr.Owen made aware and asked that we continue to give the IV Labetalol Q 2 hrs Prn for SBP > 150 as currently ordered. Will continue to monitor closely and reported to receiving nurse at hand-off.

## 2018-05-16 NOTE — Progress Notes (Signed)
Patient transported to room 2C-14 via bed. Patient alert and oriented X 4, and in stable condition. All personal items taken by husband and daughter. Report given to Rosalita Chessman.

## 2018-05-16 NOTE — Progress Notes (Signed)
      [address]       [place] [address]             [phone]        CARDIOTHORACIC SURGERY PROGRESS NOTE   R1 Day Post-Op Procedure(s) (LRB): VIDEO ASSISTED THORACOSCOPY (VATS)/LUNG RESECTION (Right) RIGHT UPPER LOBE WEDGE RESECTION WITH LYMPH NODE DISECTION (Right) MINI/LIMITED THORACOTOMY RIGHT  (Right)  Subjective: Looks good.  Minimal pain.  No SOB  Objective: Vital signs: BP Readings from Last 1 Encounters:  05/16/18 (!) 141/77   Pulse Readings from Last 1 Encounters:  05/16/18 77   Resp Readings from Last 1 Encounters:  05/16/18 16   Temp Readings from Last 1 Encounters:  05/16/18 98.8 F (37.1 C)    Hemodynamics:    Physical Exam:  Rhythm:   sinus  Breath sounds: Clear w/ few rhonchi  Heart sounds:  RRR  Incisions:  Dressings dry, intact  Abdomen:  Soft, non-distended, non-tender  Extremities:  Warm, well-perfused  Chest tubes:  low volume thin serosanguinous output, no air leak    Intake/Output from previous day: 08/16 0701 - 08/17 0700 In: 3076.1 [I.V.:2876.1; IV Piggyback:100] Out: 3265 [Urine:3155; Blood:50; Chest Tube:60] Intake/Output this shift: Total I/O In: 220 [I.V.:220] Out: 30 [Chest Tube:30]  Lab Results:  CBC: Recent Labs    05/16/18 0550  WBC 14.8*  HGB 11.4*  HCT 35.5*  PLT 390    BMET:  Recent Labs    05/16/18 0550  NA 135  K 4.3  CL 101  CO2 25  GLUCOSE 102*  BUN 12  CREATININE 0.68  CALCIUM 8.7*     PT/INR:  No results for input(s): LABPROT, INR in the last 72 hours.  CBG (last 3)  Recent Labs    05/15/18 1210 05/15/18 2343 05/16/18 0600  GLUCAP 123* 127* 103*    ABG    Component Value Date/Time   PHART 7.367 05/15/2018 1122   PCO2ART 48.5 (H) 05/15/2018 1122   PO2ART 90.3 05/15/2018 1122   HCO3 27.1 05/15/2018 1122   O2SAT 96.3 05/15/2018 1122    CXR: PORTABLE CHEST 1 VIEW  COMPARISON:  05/15/2018  FINDINGS: Right IJ central venous catheter unchanged with  tip over the SVC. Right-sided chest tube in place with tip over the right apex. Lungs are adequately inflated with stable minimal left base opacification likely small amount of pleural fluid with atelectasis. Mild linear atelectasis right mid lung and right base. No pneumothorax. Cardiomediastinal silhouette and remainder of the exam is unchanged.  IMPRESSION: Mild stable left base opacification likely small amount of pleural fluid/atelectasis. No pneumothorax.  Tubes and lines as described.   Electronically Signed   By: Marin Olp M.D.   On: 05/16/2018 07:41   Assessment/Plan: S/P Procedure(s) (LRB): VIDEO ASSISTED THORACOSCOPY (VATS)/LUNG RESECTION (Right) RIGHT UPPER LOBE WEDGE RESECTION WITH LYMPH NODE DISECTION (Right) MINI/LIMITED THORACOTOMY RIGHT  (Right)  Doing very well POD1 Minimal pain and breathing comfortably No air leak and minimal chest tube drainage, CXR looks clear Somewhat hypertensive    Mobilize  D/C aline  D/C IV fluids  Restart home meds for BP  Transfer step down  Rexene Alberts, MD 05/16/2018 9:43 AM

## 2018-05-16 NOTE — Progress Notes (Signed)
CRITICAL VALUE ALERT  Critical Value:  Lung tissue culture showed few pseudomonas originosa  Date & Time Notied:  05/16/2018 1505  Provider Notified: Dr.Owen  Orders Received/Actions taken: Antibiotics ordered.

## 2018-05-16 NOTE — Progress Notes (Signed)
Pharmacy Antibiotic Note  Melissa Molina is a 58 y.o. female admitted on 05/15/2018 with RUL apical nodule for planned R-VATS and wedge resection - done on 8/16.  Pharmacy has been consulted for Zosyn due to intra-op cultures now showing Pseudomonas.   Plan: - Start Zosyn 3.375g IV every 8 hours - Will continue to follow renal function, culture results, LOT, and antibiotic de-escalation plans   Weight: 174 lb (78.9 kg)  Temp (24hrs), Avg:98.5 F (36.9 C), Min:97.6 F (36.4 C), Max:99 F (37.2 C)  Recent Labs  Lab 05/12/18 1511 05/16/18 0550  WBC 9.8 14.8*  CREATININE 0.98 0.68    Estimated Creatinine Clearance: 81.2 mL/min (by C-G formula based on SCr of 0.68 mg/dL).    No Known Allergies  Antimicrobials this admission: Cefazolin pre/post-op on 8/16 Zosyn 8/17 >>  Microbiology results: 8/16 Tissue cx >> Pseudomonas Aeruginosa 8/16 Lymph node >>  Thank you for allowing pharmacy to be a part of this patient's care.  Alycia Rossetti, PharmD, BCPS Clinical Pharmacist Pager: [phone] Please check AMION for all [facility] numbers 05/16/2018 5:43 PM

## 2018-05-16 NOTE — Progress Notes (Signed)
13 ml from Fentanyl PCA wasted in sink with Hildred Alamin, Therapist, sports as a witness.

## 2018-05-16 NOTE — Progress Notes (Signed)
[facility] BRIEF SICU PROGRESS NOTE  1 Day Post-Op  S/P Procedure(s) (LRB): VIDEO ASSISTED THORACOSCOPY (VATS)/LUNG RESECTION (Right) RIGHT UPPER LOBE WEDGE RESECTION WITH LYMPH NODE DISECTION (Right) MINI/LIMITED THORACOTOMY RIGHT  (Right)   Stable day Lung tissue cultures reportedly growing Pseudomonas, sensitivities pending  Plan: Will start Zosyn for now and f/u final culture results  Rexene Alberts, MD 05/16/2018 5:39 PM

## 2018-05-17 ENCOUNTER — Encounter (HOSPITAL_COMMUNITY): Payer: Self-pay

## 2018-05-17 LAB — CBC
HCT: 34.5 % — ABNORMAL LOW (ref 36.0–46.0)
Hemoglobin: 11.1 g/dL — ABNORMAL LOW (ref 12.0–15.0)
MCH: 31.3 pg (ref 26.0–34.0)
MCHC: 32.2 g/dL (ref 30.0–36.0)
MCV: 97.2 fL (ref 78.0–100.0)
PLATELETS: 347 10*3/uL (ref 150–400)
RBC: 3.55 MIL/uL — AB (ref 3.87–5.11)
RDW: 12.9 % (ref 11.5–15.5)
WBC: 11.5 10*3/uL — AB (ref 4.0–10.5)

## 2018-05-17 LAB — COMPREHENSIVE METABOLIC PANEL
ALT: 21 U/L (ref 0–44)
AST: 38 U/L (ref 15–41)
Albumin: 3.3 g/dL — ABNORMAL LOW (ref 3.5–5.0)
Alkaline Phosphatase: 70 U/L (ref 38–126)
Anion gap: 5 (ref 5–15)
BUN: 14 mg/dL (ref 6–20)
CHLORIDE: 103 mmol/L (ref 98–111)
CO2: 29 mmol/L (ref 22–32)
CREATININE: 0.79 mg/dL (ref 0.44–1.00)
Calcium: 9.1 mg/dL (ref 8.9–10.3)
GFR calc Af Amer: >60 mL/min (ref >60)
GFR calc non Af Amer: >60 mL/min (ref >60)
Glucose, Bld: 97 mg/dL (ref 70–99)
Potassium: 4.1 mmol/L (ref 3.5–5.1)
SODIUM: 137 mmol/L (ref 135–145)
Total Bilirubin: 0.8 mg/dL (ref 0.3–1.2)
Total Protein: 6.8 g/dL (ref 6.5–8.1)

## 2018-05-17 NOTE — Progress Notes (Addendum)
[address]       [place] [address]             [phone]      2 Days Post-Op Procedure(s) (LRB): VIDEO ASSISTED THORACOSCOPY (VATS)/LUNG RESECTION (Right) RIGHT UPPER LOBE WEDGE RESECTION WITH LYMPH NODE DISECTION (Right) MINI/LIMITED THORACOTOMY RIGHT  (Right) Subjective: Feels ok, some incis discomfort  Objective: Vital signs in last 24 hours: Temp:  [97.6 F (36.4 C)-98.7 F (37.1 C)] 98.6 F (37 C) (08/18 0737) Pulse Rate:  [59-75] 59 (08/18 0737) Cardiac Rhythm: Normal sinus rhythm (08/18 0426) Resp:  [15-25] 20 (08/18 0737) BP: (95-154)/(64-89) 129/66 (08/18 0737) SpO2:  [95 %-100 %] 95 % (08/18 0737) Arterial Line BP: (133-146)/(63-67) 137/67 (08/17 1030) Weight:  [79 kg] 79 kg (08/17 1905)  Hemodynamic parameters for last 24 hours:    Intake/Output from previous day: 08/17 0701 - 08/18 0700 In: 1412 [P.O.:840; I.V.:492.7; IV Piggyback:79.4] Out: 2106 [Urine:1950; Chest Tube:156] Intake/Output this shift: No intake/output data recorded.  General appearance: alert, cooperative and no distress Heart: regular rate and rhythm Lungs: clear to auscultation bilaterally Abdomen: benign Extremities: no edema or calf tenderness Wound: incis healing well  Lab Results: Recent Labs    05/16/18 0550 05/17/18 0248  WBC 14.8* 11.5*  HGB 11.4* 11.1*  HCT 35.5* 34.5*  PLT 390 347   BMET:  Recent Labs    05/16/18 0550 05/17/18 0248  NA 135 137  K 4.3 4.1  CL 101 103  CO2 25 29  GLUCOSE 102* 97  BUN 12 14  CREATININE 0.68 0.79  CALCIUM 8.7* 9.1    PT/INR: No results for input(s): LABPROT, INR in the last 72 hours. ABG    Component Value Date/Time   PHART 7.367 05/15/2018 1122   HCO3 27.1 05/15/2018 1122   O2SAT 96.3 05/15/2018 1122   CBG (last 3)  Recent Labs    05/15/18 2343 05/16/18 0600 05/16/18 1152  GLUCAP 127* 103* 112*    Meds Scheduled Meds: . acetaminophen  1,000 mg Oral Q6H   Or  . acetaminophen  (TYLENOL) oral liquid 160 mg/5 mL  1,000 mg Oral Q6H  . amLODipine  10 mg Oral Daily  . bisacodyl  10 mg Oral Daily  . DULoxetine  60 mg Oral Daily  . famotidine  20 mg Oral BID  . gabapentin  100 mg Oral TID  . lisinopril  20 mg Oral Daily  . meloxicam  7.5 mg Oral Daily  . metoprolol tartrate  25 mg Oral BID  . multivitamin with minerals  1 tablet Oral Daily  . pravastatin  40 mg Oral q1800  . senna-docusate  1 tablet Oral QHS  . sodium chloride flush  10-40 mL Intracatheter Q12H   Continuous Infusions: . sodium chloride Stopped (05/16/18 1746)  . bupivacaine ON-Q pain pump    . piperacillin-tazobactam (ZOSYN)  IV 3.375 g (05/17/18 0138)  . potassium chloride     PRN Meds:.labetalol, oxyCODONE, potassium chloride, sodium chloride flush, traMADol  Xrays Dg Chest Port 1 View  Result Date: 05/16/2018 CLINICAL DATA:  Pneumothorax. EXAM: PORTABLE CHEST 1 VIEW COMPARISON:  05/15/2018 FINDINGS: Right IJ central venous catheter unchanged with tip over the SVC. Right-sided chest tube in place with tip over the right apex. Lungs are adequately inflated with stable minimal left base opacification likely small amount of pleural fluid with atelectasis. Mild linear atelectasis right mid lung and right base. No pneumothorax. Cardiomediastinal silhouette and remainder of the exam  is unchanged. IMPRESSION: Mild stable left base opacification likely small amount of pleural fluid/atelectasis. No pneumothorax. Tubes and lines as described. Electronically Signed   By: Marin Olp M.D.   On: 05/16/2018 07:41   Dg Chest Port 1 View  Result Date: 05/15/2018 CLINICAL DATA:  Chest tube insertion EXAM: PORTABLE CHEST 1 VIEW COMPARISON:  05/12/2018 FINDINGS: Interval right upper partial lobectomy with postsurgical changes in the upper paramediastinal region. Chest tube directed towards the apex. No pleural effusion or pneumothorax. Left midlung atelectasis. Stable cardiomediastinal silhouette. Right IJ central  venous catheter with the tip projecting over the SVC. No acute osseous abnormality. Soft tissue emphysema in the right lateral chest wall at the chest tube insertion site. IMPRESSION: Interval right upper partial lobectomy with postsurgical changes in the upper paramediastinal region. Chest tube directed towards the apex. No pleural effusion or pneumothorax. Electronically Signed   By: Kathreen Devoid   On: 05/15/2018 11:29   Results for orders placed or performed during the hospital encounter of 05/15/18  Acid Fast Smear (AFB)     Status: None   Collection Time: 05/15/18  9:20 AM  Result Value Ref Range Status   AFB Specimen Processing Comment  Final    Comment: Tissue Grinding and Digestion/Decontamination   Acid Fast Smear Negative  Final    Comment: (NOTE) Performed At: [facility] [address] [place], [place] [id number] Rush Farmer MD NW:[id number]    Source (AFB) LYMPH NODE  Final    Comment: AZYGOUS 4R Performed at [facility], [place] [address] [place], [place] [address]   Aerobic/Anaerobic Culture (surgical/deep wound)     Status: None (Preliminary result)   Collection Time: 05/15/18 10:39 AM  Result Value Ref Range Status   Specimen Description TISSUE RIGHT UPPER LUNG  Final   Special Requests NONE  Final   Gram Stain   Final    MODERATE WBC PRESENT, PREDOMINANTLY PMN NO ORGANISMS SEEN    Culture   Final    FEW PSEUDOMONAS AERUGINOSA SUSCEPTIBILITIES TO FOLLOW CRITICAL RESULT CALLED TO, READ BACK BY AND VERIFIED WITH: RN Gerrianne Scale [id number] MLM Performed at [facility], [place] [address] [place], [place] [address]    Report Status PENDING  Incomplete  Acid Fast Smear (AFB)     Status: None   Collection Time: 05/15/18 10:39 AM  Result Value Ref Range Status   AFB Specimen Processing Comment  Final    Comment: Tissue Grinding and Digestion/Decontamination   Acid Fast Smear Negative  Final    Comment: (NOTE) Performed At: [facility] [place], [place] [id number] Rush Farmer MD QI:[id number]    Source (AFB) TISSUE  Final    Comment: RIGHT UPPER LUNG Performed at [facility], [place] [address] [place], [place] [address]    Assessment/Plan: S/P Procedure(s) (LRB): VIDEO ASSISTED THORACOSCOPY (VATS)/LUNG RESECTION (Right) RIGHT UPPER LOBE WEDGE RESECTION WITH LYMPH NODE DISECTION (Right) MINI/LIMITED THORACOTOMY RIGHT  (Right)  1 doing well 2 minor drainage from CT , no air leak- will place on H20 seal today 3 hemodyn stable in sinus , SBP fairly variable 90's to 150's- cont current lisinopril/norvasc 4 H/H stable 5 leukocytosis improved 6 BS controlled 7 on zosyn IV 8 path pending 9 Mobilize/pulm toilet  LOS: 2 days    Melissa Molina 05/17/2018 Pager [phone]  I have seen and examined the patient and agree with the assessment and plan as outlined.  Chest tube to water  seal.  Rexene Alberts, MD 05/17/2018 11:44 AM

## 2018-05-18 ENCOUNTER — Inpatient Hospital Stay (HOSPITAL_COMMUNITY): Payer: BLUE CROSS/BLUE SHIELD

## 2018-05-18 LAB — BPAM RBC
Blood Product Expiration Date: 201909122359
Blood Product Expiration Date: 201909122359
ISSUE DATE / TIME: 201908160722
ISSUE DATE / TIME: 201908160722
Unit Type and Rh: 5100
Unit Type and Rh: 5100

## 2018-05-18 LAB — TYPE AND SCREEN
ABO/RH(D): O POS
Antibody Screen: NEGATIVE
Unit division: 0
Unit division: 0

## 2018-05-18 LAB — GLUCOSE, CAPILLARY: Glucose-Capillary: 109 mg/dL — ABNORMAL HIGH (ref 70–99)

## 2018-05-18 MED ORDER — CIPROFLOXACIN HCL 500 MG PO TABS
750.0000 mg | ORAL_TABLET | Freq: Two times a day (BID) | ORAL | Status: DC
  Administered 2018-05-18 – 2018-05-19 (×2): 750 mg via ORAL
  Filled 2018-05-18 (×2): qty 2

## 2018-05-18 MED ORDER — CIPROFLOXACIN HCL 500 MG PO TABS
500.0000 mg | ORAL_TABLET | Freq: Two times a day (BID) | ORAL | Status: DC
  Administered 2018-05-18: 500 mg via ORAL
  Filled 2018-05-18: qty 1

## 2018-05-18 MED ORDER — PIPERACILLIN-TAZOBACTAM 3.375 G IVPB
3.3750 g | Freq: Three times a day (TID) | INTRAVENOUS | Status: AC
  Administered 2018-05-18: 3.375 g via INTRAVENOUS
  Filled 2018-05-18: qty 50

## 2018-05-18 NOTE — Discharge Instructions (Signed)
Discharge Instructions:  1. You may shower, please wash incisions daily with soap and water and keep dry.  If you wish to cover wounds with dressing you may do so but please keep clean and change daily.  No tub baths or swimming until incisions have completely healed.  If your incisions become red or develop any drainage please call our office at [phone]  2. No Driving until cleared by Dr. Lucianne Lei Trigt's office and you are no longer using narcotic pain medications  3. Fever of 101.5 for at least 24 hours with no source, please contact our office at [phone]  5. Activity- up as tolerated, please walk at least 3 times per day.  Avoid strenuous activity for several weeks  6. If any questions or concerns arise, please do not hesitate to contact our office at [phone]

## 2018-05-18 NOTE — Progress Notes (Addendum)
      [address]       [place] [address]             [phone]      3 Days Post-Op Procedure(s) (LRB): VIDEO ASSISTED THORACOSCOPY (VATS)/LUNG RESECTION (Right) RIGHT UPPER LOBE WEDGE RESECTION WITH LYMPH NODE DISECTION (Right) MINI/LIMITED THORACOTOMY RIGHT  (Right)   Subjective:  Patient doing okay.  She has some burning/numbness near her right breast.  + ambulation  + BM  Objective: Vital signs in last 24 hours: Temp:  [98.1 F (36.7 C)-98.7 F (37.1 C)] 98.7 F (37.1 C) (08/19 0740) Pulse Rate:  [60-81] 60 (08/19 0740) Cardiac Rhythm: Normal sinus rhythm (08/19 0740) Resp:  [15-22] 15 (08/19 0740) BP: (107-129)/(76-89) 116/77 (08/19 0740) SpO2:  [95 %-99 %] 96 % (08/19 0740)  Intake/Output from previous day: 08/18 0701 - 08/19 0700 In: 1169.1 [P.O.:900; I.V.:100; IV Piggyback:169.1] Out: 1242 [Urine:1151; Stool:1; Chest Tube:90]  General appearance: alert, cooperative and no distress Heart: regular rate and rhythm Lungs: clear to auscultation bilaterally Abdomen: soft, non-tender; bowel sounds normal; no masses,  no organomegaly Extremities: extremities normal, atraumatic, no cyanosis or edema Wound: clean and dry  Lab Results: Recent Labs    05/16/18 0550 05/17/18 0248  WBC 14.8* 11.5*  HGB 11.4* 11.1*  HCT 35.5* 34.5*  PLT 390 347   BMET:  Recent Labs    05/16/18 0550 05/17/18 0248  NA 135 137  K 4.3 4.1  CL 101 103  CO2 25 29  GLUCOSE 102* 97  BUN 12 14  CREATININE 0.68 0.79  CALCIUM 8.7* 9.1    PT/INR: No results for input(s): LABPROT, INR in the last 72 hours. ABG    Component Value Date/Time   PHART 7.367 05/15/2018 1122   HCO3 27.1 05/15/2018 1122   O2SAT 96.3 05/15/2018 1122   CBG (last 3)  Recent Labs    05/15/18 2343 05/16/18 0600 05/16/18 1152  GLUCAP 127* 103* 112*    Assessment/Plan: S/P Procedure(s) (LRB): VIDEO ASSISTED THORACOSCOPY (VATS)/LUNG RESECTION (Right) RIGHT UPPER LOBE WEDGE  RESECTION WITH LYMPH NODE DISECTION (Right) MINI/LIMITED THORACOTOMY RIGHT  (Right)  1. Chest tube- no air leak, will d/c chest tube today 2. CV-NSR, BP controlled- continue Lopressor, Norvasc, Lisinopril 3. Pulm- no acute issues, continue IS, CXR is stable, without effusion or significant pneumothorax 4. ID- on ABX for CAP 5. Dispo- patient stable, d/c chest tube today, repeat CXR in AM if stable can likely d/c home tomorrow   LOS: 3 days    Ellwood Handler 05/18/2018  Expected post thoracotomy pain DC chest tube 10 days of totoal IV/po antibiotic to cover pseudomonas CXR in am  patient examined and medical record reviewed,agree with above note. Tharon Aquas Trigt III 05/18/2018

## 2018-05-18 NOTE — Discharge Summary (Addendum)
Physician Discharge Summary  Patient ID: Melissa Molina MRN: [id number] DOB/AGE: 02/21/1961 57 y.o.  Admit date: 05/15/2018 Discharge date: 05/19/2018  Admission Diagnoses:  Patient Active Problem List   Diagnosis Date Noted  . Nodule of upper lobe of right lung 05/15/2018  . Lung nodule seen on imaging study 03/10/2018  . Plantar fasciitis, bilateral 11/25/2017  . Pre-ulcerative calluses 02/29/2016  . Benign essential hypertension 02/07/2016  . Esophageal reflux 02/07/2016  . Kidney stone 05/30/2014   Discharge Diagnoses:   Patient Active Problem List   Diagnosis Date Noted  . Nodule of upper lobe of right lung 05/15/2018  . Lung nodule seen on imaging study 03/10/2018  . Plantar fasciitis, bilateral 11/25/2017  . Pre-ulcerative calluses 02/29/2016  . Benign essential hypertension 02/07/2016  . Esophageal reflux 02/07/2016  . Kidney stone 05/30/2014   Discharged Condition: good  History of Present Illness:  Melissa Molina is a 57 yo AA female with a known history of RUL nodule.  She has been routinely followed by Dr. [name].  Most recent CT scan showed growth in the nodule from previous imaging.  PET CT scan was obtained and revealed the lesion to be hypermetabolic.  She was referred to Interventional Radiology for CT guided biopsy.  This was performed on 03/25/2018 and showed necrotic tissue without evidence of malignancy.  It was felt due to the increasing nature of the nodule she should undergo resection of the nodule for definitive treatment and diagnosis.  She was referred to [facility] and evaluated by Dr. Prescott Gum on 04/15/2018 at which time he felt VATS procedure would be indicated.  She would require additional testing to be completed prior to proceeding with surgery.  The risks and benefits of the procedure were explained to the patient and she was agreeable to proceed.  Hospital Course:   Melissa Molina presented to [facility] hospital on 05/15/2018.  He was taken to the  operating room and underwent Right VATS, Right Mini Thoracotomy with wedge resection of RUL lung nodule, lymph node dissection, and ON-Q placement.  She tolerated the procedure without difficulty, was extubated and taken to the PACU in stable condition.  She was hypertensive post operatively and restarted on her home medications.  In addition, she was put on Amlodipine and Lopressor. Her chest tube was free from air leak and transitioned to water seal on POD #1.  OR cultures showed Pseudomonas to be present in the tissue nodule.  She was started on IV Zosyn for this and will be transitioned to Cipro at discharge.  The patients post operative CXR remained stable.  Her chest tube was removed on 05/18/2018.  Follow up CXR showed a persistent small, right apical pneumothorax, relatively stable in size from previous and small bilateral pleural effusions.  She is ambulating independently.  Her pain is well controlled.  Her incisions are healing without evidence of infection.  BP has been well controlled with Lopressor, Lisinopril, and Amlodipine. I decided to stop Amlodipine and restart a higher dose or HCTZ. Patient needs to follow up with primary care doctor for further management of her hypertension. As discussed with Dr. Prescott Gum, she is medically stable for discharge home today.        Significant Diagnostic Studies: radiology:   PET scan: Lobulated 19 x 16 mm right apical lung lesion is weakly hypermetabolic but suspicious for neoplastic process. Recommend biopsy.  No enlarged or hypermetabolic mediastinal or hilar lymph nodes or evidence of metastatic disease elsewhere.   TISSUE RIGHT  UPPER LUNG   Special Requests NONE   Gram Stain MODERATE WBC PRESENT, PREDOMINANTLY PMN  NO ORGANISMS SEEN  Performed at [facility], [place] [address], [place], [place] [address]   Culture FEW PSEUDOMONAS AERUGINOSA  CRITICAL RESULT CALLED TO, READ BACK BY AND VERIFIED WITH: RN K MCKIBBEN [phone] MLM  NO  ANAEROBES ISOLATED; CULTURE IN PROGRESS FOR 5 DAYS   Report Status PENDING   Organism ID, Bacteria PSEUDOMONAS AERUGINOSA    Treatments: surgery:   1.  Right video-assisted thoracoscopic surgery, wedge resection of right upper lobe nodule.   2.  Mediastinal lymph node dissection. 3.  Placement of On-Q wound analgesia system.   Discharge Exam: Blood pressure (!) 136/96, pulse 83, temperature 98.2 F (36.8 C), temperature source Oral, resp. rate 16, height 5\' 6"  (1.676 m), weight 79 kg, SpO2 97 %. Cardiovascular: RRR Pulmonary: Mostly clear Abdomen: Soft, non tender, bowel sounds present. Extremities: No lower extremity edema. Wounds: Clean and dry.  No erythema or signs of infection.  Disposition: Discharge disposition: 01-Home or Self Care     Home  Discharge Medications:   Allergies as of 05/19/2018   No Known Allergies     Medication List    TAKE these medications   acetaminophen 500 MG tablet Commonly known as:  TYLENOL Take 1,000 mg by mouth daily as needed for moderate pain.   ciprofloxacin 750 MG tablet Commonly known as:  CIPRO Take 1 tablet (750 mg total) by mouth 2 (two) times daily.   DULoxetine 60 MG capsule Commonly known as:  CYMBALTA Take 60 mg by mouth daily.   gabapentin 100 MG capsule Commonly known as:  NEURONTIN Take 100 mg by mouth 3 (three) times daily.   hydrochlorothiazide 12.5 MG capsule Commonly known as:  MICROZIDE Take 2 capsules (25 mg total) by mouth daily. What changed:  how much to take   lisinopril 20 MG tablet Commonly known as:  PRINIVIL,ZESTRIL Take 20 mg by mouth daily.   lovastatin 20 MG tablet Commonly known as:  MEVACOR Take 40 mg by mouth every evening.   meloxicam 7.5 MG tablet Commonly known as:  MOBIC Take 7.5 mg by mouth daily.   metoprolol tartrate 25 MG tablet Commonly known as:  LOPRESSOR Take 1 tablet (25 mg total) by mouth 2 (two) times daily.   multivitamin with minerals Tabs tablet Take 1  tablet by mouth daily.   oxyCODONE 5 MG immediate release tablet Commonly known as:  Oxy IR/ROXICODONE Take 5 mg by mouth every 4-6 hours PRN severe pain.   ranitidine 150 MG tablet Commonly known as:  ZANTAC Take 150 mg by mouth 2 (two) times daily.   VISINE OP Place 1 drop into both eyes daily as needed (dry eyes).      Follow-up Information    Prescott Gum, Collier Salina, MD. Go on 06/17/2018.   Specialty:  Cardiothoracic Surgery Why:  PA/LAT CXR to be taken (at [facility] which is in the same building as Dr. Lucianne Lei Trigt's office) on 06/17/2018 at 2:30 pm;Appointment time is 3:00 pm Contact information: [place] [address] [phone]        Nurse Follow up on 05/29/2018.   Why:  Appointment is with nurse only to have chest tube sutures removed. Office will call with appointment date and time Contact information: [place] [address]       [facility]. Call.  Specialty:  Pediatrics Why:  for a follow up appointment regarding further blood pressure management as changes were made to medications after surgery. Contact information: [address] [place] [address] [phone]           Signed: Arnoldo Lenis 05/19/2018, 9:28 AM   patient examined and medical record reviewed,agree with above note. Tharon Aquas Trigt III 05/20/2018

## 2018-05-19 ENCOUNTER — Inpatient Hospital Stay (HOSPITAL_COMMUNITY): Payer: BLUE CROSS/BLUE SHIELD

## 2018-05-19 MED ORDER — FUROSEMIDE 40 MG PO TABS
40.0000 mg | ORAL_TABLET | Freq: Once | ORAL | Status: DC
  Administered 2018-05-19: 40 mg via ORAL
  Filled 2018-05-19: qty 1

## 2018-05-19 MED ORDER — HYDROCHLOROTHIAZIDE 12.5 MG PO CAPS: 25.0000 mg | ORAL_CAPSULE | Freq: Every day | ORAL | 1 refills | Status: AC

## 2018-05-19 MED ORDER — METOPROLOL TARTRATE 25 MG PO TABS: 25.0000 mg | ORAL_TABLET | Freq: Two times a day (BID) | ORAL | 1 refills | Status: DC

## 2018-05-19 MED ORDER — ADULT MULTIVITAMIN W/MINERALS CH: 1.0000 | ORAL_TABLET | Freq: Every day | ORAL | Status: DC

## 2018-05-19 MED ORDER — OXYCODONE HCL 5 MG PO TABS: ORAL_TABLET | ORAL | 0 refills | Status: DC

## 2018-05-19 MED ORDER — CIPROFLOXACIN HCL 750 MG PO TABS: 750.0000 mg | ORAL_TABLET | Freq: Two times a day (BID) | ORAL | 0 refills | Status: DC
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
## 2018-05-19 NOTE — Progress Notes (Addendum)
      [address]       [place] [address]             [phone]       4 Days Post-Op Procedure(s) (LRB): VIDEO ASSISTED THORACOSCOPY (VATS)/LUNG RESECTION (Right) RIGHT UPPER LOBE WEDGE RESECTION WITH LYMPH NODE DISECTION (Right) MINI/LIMITED THORACOTOMY RIGHT  (Right)  Subjective: Patient with incisional pain this am.  Objective: Vital signs in last 24 hours: Temp:  [97.7 F (36.5 C)-98.4 F (36.9 C)] 98.2 F (36.8 C) (08/20 0803) Pulse Rate:  [62-97] 83 (08/20 0803) Cardiac Rhythm: Normal sinus rhythm (08/20 0751) Resp:  [16-20] 16 (08/20 0803) BP: (105-141)/(70-96) 136/96 (08/20 0803) SpO2:  [96 %-97 %] 97 % (08/20 0803)     Intake/Output from previous day: 08/19 0701 - 08/20 0700 In: 1060.4 [P.O.:920; IV Piggyback:140.4] Out: 775 [Urine:775]   Physical Exam:  Cardiovascular: RRR Pulmonary: Mostly clear Abdomen: Soft, non tender, bowel sounds present. Extremities: No lower extremity edema. Wounds: Clean and dry.  No erythema or signs of infection.   Lab Results: CBC: Recent Labs    05/17/18 0248  WBC 11.5*  HGB 11.1*  HCT 34.5*  PLT 347   BMET:  Recent Labs    05/17/18 0248  NA 137  K 4.1  CL 103  CO2 29  GLUCOSE 97  BUN 14  CREATININE 0.79  CALCIUM 9.1    PT/INR: No results for input(s): LABPROT, INR in the last 72 hours. ABG:  INR: Will add last result for INR, ABG once components are confirmed Will add last 4 CBG results once components are confirmed  Assessment/Plan:  1. CV - SR. Continue with Lopressor 25 mg bid, Amlodipine 10 mg daily, and Lisinopril 20 mg daily. Will stop Amlodipine and increase HCTZ to 25 mg daily (she was on 12.5 mg daily prior to surgery). She will need to follow up with her medical doctor after discharge for BP management. 2.  Pulmonary - On room air. CXR this am shows a stable, small right apical pneumothorax and small bilateral pleural effusions. Encourage incentive spirometer. 3.  ID-continue Cipro 750 mg bid for one week for Pseudomonas Aeruginosa. 4. Remove central line 5. As discussed with Dr. Prescott Gum, will discharge home.  Donielle M ZimmermanPA-C 05/19/2018,8:20 AM [phone]   Discharge instructions reviewed with patient  patient examined and medical record reviewed,agree with above note. Tharon Aquas Trigt III 05/19/2018

## 2018-05-19 NOTE — Progress Notes (Signed)
Discharged home accompanied by husband, discharge instructions and prescription given to pt. Belongings taken home.

## 2018-05-20 LAB — AEROBIC/ANAEROBIC CULTURE W GRAM STAIN (SURGICAL/DEEP WOUND)

## 2018-05-22 ENCOUNTER — Other Ambulatory Visit: Payer: Self-pay | Admitting: Oncology

## 2018-05-29 ENCOUNTER — Other Ambulatory Visit: Payer: Self-pay

## 2018-05-29 ENCOUNTER — Ambulatory Visit (INDEPENDENT_AMBULATORY_CARE_PROVIDER_SITE_OTHER): Payer: Self-pay

## 2018-05-29 DIAGNOSIS — Z4802 Encounter for removal of sutures: Secondary | ICD-10-CM

## 2018-05-29 NOTE — Progress Notes (Signed)
Patient arrived for nurse visit to remove 1 suture post- procedure.  Sutures removed with no signs/ symptoms of infection noted.  Patient tolerated procedure well.  Patient/ family instructed to keep the incision sites clean and dry.  Patient/ family acknowledged instructions given. Patient is aware of follow-up appointment.

## 2018-06-08 ENCOUNTER — Encounter: Payer: Self-pay | Admitting: Oncology

## 2018-06-08 ENCOUNTER — Other Ambulatory Visit: Payer: Self-pay | Admitting: Oncology

## 2018-06-12 LAB — FUNGUS CULTURE WITH STAIN

## 2018-06-12 LAB — FUNGAL ORGANISM REFLEX

## 2018-06-12 LAB — FUNGUS CULTURE RESULT

## 2018-06-16 ENCOUNTER — Other Ambulatory Visit: Payer: Self-pay | Admitting: Cardiothoracic Surgery

## 2018-06-16 DIAGNOSIS — R911 Solitary pulmonary nodule: Secondary | ICD-10-CM

## 2018-06-17 ENCOUNTER — Encounter: Payer: Self-pay | Admitting: Surgical

## 2018-06-17 ENCOUNTER — Ambulatory Visit: Payer: Self-pay | Admitting: Cardiothoracic Surgery

## 2018-06-17 ENCOUNTER — Ambulatory Visit
Admission: RE | Admit: 2018-06-17 | Discharge: 2018-06-17 | Disposition: A | Discharge status: Home / Self Care | Payer: BLUE CROSS/BLUE SHIELD | Source: Ambulatory Visit | Attending: Cardiothoracic Surgery | Admitting: Cardiothoracic Surgery

## 2018-06-17 ENCOUNTER — Ambulatory Visit (INDEPENDENT_AMBULATORY_CARE_PROVIDER_SITE_OTHER): Payer: Self-pay | Admitting: Surgical

## 2018-06-17 VITALS — BP 102/66 | HR 60 | Resp 20 | Ht 66.0 in | Wt 175.0 lb

## 2018-06-17 DIAGNOSIS — R911 Solitary pulmonary nodule: Secondary | ICD-10-CM
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
## 2018-06-17 NOTE — Patient Instructions (Signed)
Continue current lifting restrictions.

## 2018-06-27 LAB — ACID FAST CULTURE WITH REFLEXED SENSITIVITIES (MYCOBACTERIA)
Acid Fast Culture: NEGATIVE
Acid Fast Culture: NEGATIVE

## 2018-07-21 ENCOUNTER — Other Ambulatory Visit: Payer: Self-pay | Admitting: Cardiothoracic Surgery

## 2018-07-21 DIAGNOSIS — R911 Solitary pulmonary nodule: Secondary | ICD-10-CM

## 2018-07-22 ENCOUNTER — Ambulatory Visit (INDEPENDENT_AMBULATORY_CARE_PROVIDER_SITE_OTHER): Payer: Self-pay | Admitting: Cardiothoracic Surgery

## 2018-07-22 ENCOUNTER — Other Ambulatory Visit: Payer: Self-pay

## 2018-07-22 ENCOUNTER — Encounter: Payer: Self-pay | Admitting: Cardiothoracic Surgery

## 2018-07-22 ENCOUNTER — Ambulatory Visit
Admission: RE | Admit: 2018-07-22 | Discharge: 2018-07-22 | Disposition: A | Discharge status: Home / Self Care | Payer: BLUE CROSS/BLUE SHIELD | Source: Ambulatory Visit | Attending: Cardiothoracic Surgery | Admitting: Cardiothoracic Surgery

## 2018-07-22 VITALS — BP 101/72 | HR 76 | Resp 16 | Ht 66.0 in | Wt 176.2 lb

## 2018-07-22 DIAGNOSIS — R911 Solitary pulmonary nodule: Secondary | ICD-10-CM

## 2018-07-22 DIAGNOSIS — Z09 Encounter for follow-up examination after completed treatment for conditions other than malignant neoplasm: Secondary | ICD-10-CM
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
## 2018-08-18 ENCOUNTER — Other Ambulatory Visit: Payer: Self-pay | Admitting: Physician Assistant

## 2018-10-20 ENCOUNTER — Other Ambulatory Visit: Payer: Self-pay | Admitting: Cardiothoracic Surgery

## 2018-10-20 DIAGNOSIS — R911 Solitary pulmonary nodule: Secondary | ICD-10-CM

## 2018-10-21 ENCOUNTER — Other Ambulatory Visit: Payer: Self-pay

## 2018-10-21 ENCOUNTER — Ambulatory Visit
Admission: RE | Admit: 2018-10-21 | Discharge: 2018-10-21 | Disposition: A | Discharge status: Home / Self Care | Payer: BLUE CROSS/BLUE SHIELD | Source: Ambulatory Visit | Attending: Cardiothoracic Surgery | Admitting: Cardiothoracic Surgery

## 2018-10-21 ENCOUNTER — Ambulatory Visit (INDEPENDENT_AMBULATORY_CARE_PROVIDER_SITE_OTHER): Payer: BLUE CROSS/BLUE SHIELD | Admitting: Cardiothoracic Surgery

## 2018-10-21 ENCOUNTER — Other Ambulatory Visit: Payer: Self-pay | Admitting: *Deleted

## 2018-10-21 ENCOUNTER — Encounter: Payer: Self-pay | Admitting: Cardiothoracic Surgery

## 2018-10-21 VITALS — BP 120/90 | HR 80 | Resp 16 | Ht 66.0 in | Wt 188.0 lb

## 2018-10-21 DIAGNOSIS — R911 Solitary pulmonary nodule: Secondary | ICD-10-CM

## 2018-10-21 DIAGNOSIS — Z09 Encounter for follow-up examination after completed treatment for conditions other than malignant neoplasm: Secondary | ICD-10-CM
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
## 2018-11-09 ENCOUNTER — Other Ambulatory Visit: Payer: Self-pay | Admitting: Thoracic Surgery (Cardiothoracic Vascular Surgery)

## 2018-11-09 ENCOUNTER — Other Ambulatory Visit: Payer: Self-pay | Admitting: Cardiothoracic Surgery

## 2018-11-09 DIAGNOSIS — R911 Solitary pulmonary nodule: Secondary | ICD-10-CM

## 2018-11-10 ENCOUNTER — Encounter: Payer: Self-pay | Admitting: Cardiothoracic Surgery

## 2018-11-10 ENCOUNTER — Other Ambulatory Visit: Payer: Self-pay

## 2018-11-10 ENCOUNTER — Ambulatory Visit
Admission: RE | Admit: 2018-11-10 | Discharge: 2018-11-10 | Disposition: A | Discharge status: Home / Self Care | Payer: BLUE CROSS/BLUE SHIELD | Source: Ambulatory Visit | Attending: Cardiothoracic Surgery | Admitting: Cardiothoracic Surgery

## 2018-11-10 ENCOUNTER — Ambulatory Visit: Payer: Self-pay | Admitting: Cardiothoracic Surgery

## 2018-11-10 ENCOUNTER — Ambulatory Visit (INDEPENDENT_AMBULATORY_CARE_PROVIDER_SITE_OTHER): Payer: BLUE CROSS/BLUE SHIELD | Admitting: Cardiothoracic Surgery

## 2018-11-10 VITALS — BP 126/77 | HR 66 | Resp 16 | Ht 66.0 in | Wt 190.0 lb

## 2018-11-10 DIAGNOSIS — Z09 Encounter for follow-up examination after completed treatment for conditions other than malignant neoplasm: Secondary | ICD-10-CM | POA: Diagnosis not present

## 2018-11-10 DIAGNOSIS — R911 Solitary pulmonary nodule: Secondary | ICD-10-CM

## 2018-11-10 DIAGNOSIS — D381 Neoplasm of uncertain behavior of trachea, bronchus and lung: Secondary | ICD-10-CM | POA: Diagnosis not present
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
## 2018-11-11 ENCOUNTER — Ambulatory Visit: Payer: Self-pay | Admitting: Cardiothoracic Surgery

## 2018-11-11 ENCOUNTER — Other Ambulatory Visit: Payer: Self-pay

## 2019-03-10 ENCOUNTER — Ambulatory Visit: Payer: Self-pay | Admitting: Cardiothoracic Surgery

## 2019-03-11 ENCOUNTER — Other Ambulatory Visit: Payer: Self-pay | Admitting: Cardiothoracic Surgery

## 2019-03-11 ENCOUNTER — Other Ambulatory Visit: Payer: Self-pay

## 2019-03-11 DIAGNOSIS — R911 Solitary pulmonary nodule: Secondary | ICD-10-CM

## 2019-03-12 ENCOUNTER — Ambulatory Visit: Payer: BC Managed Care – PPO | Admitting: Cardiothoracic Surgery

## 2019-04-20 ENCOUNTER — Other Ambulatory Visit: Payer: Self-pay

## 2019-04-21 ENCOUNTER — Encounter: Payer: Self-pay | Admitting: Cardiothoracic Surgery

## 2019-04-21 ENCOUNTER — Other Ambulatory Visit: Payer: Self-pay | Admitting: Cardiothoracic Surgery

## 2019-04-21 ENCOUNTER — Ambulatory Visit
Admission: RE | Admit: 2019-04-21 | Discharge: 2019-04-21 | Disposition: A | Discharge status: Home / Self Care | Payer: BC Managed Care – PPO | Source: Ambulatory Visit | Attending: Cardiothoracic Surgery | Admitting: Cardiothoracic Surgery

## 2019-04-21 ENCOUNTER — Ambulatory Visit (INDEPENDENT_AMBULATORY_CARE_PROVIDER_SITE_OTHER): Payer: BC Managed Care – PPO | Admitting: Cardiothoracic Surgery

## 2019-04-21 VITALS — BP 113/78 | HR 94 | Temp 97.7°F | Resp 16 | Ht 66.0 in | Wt 190.0 lb

## 2019-04-21 DIAGNOSIS — R911 Solitary pulmonary nodule: Secondary | ICD-10-CM

## 2019-04-21 DIAGNOSIS — Z09 Encounter for follow-up examination after completed treatment for conditions other than malignant neoplasm: Secondary | ICD-10-CM | POA: Diagnosis not present
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
## 2019-07-22 ENCOUNTER — Other Ambulatory Visit: Payer: Self-pay

## 2019-07-22 ENCOUNTER — Encounter (HOSPITAL_COMMUNITY): Payer: Self-pay | Admitting: Emergency Medicine

## 2019-07-22 ENCOUNTER — Emergency Department (HOSPITAL_COMMUNITY): Payer: Self-pay

## 2019-07-22 ENCOUNTER — Emergency Department (HOSPITAL_COMMUNITY)
Admission: EM | Admit: 2019-07-22 | Discharge: 2019-07-22 | Disposition: A | Discharge status: Home / Self Care | Payer: Self-pay | Attending: Emergency Medicine | Admitting: Emergency Medicine

## 2019-07-22 DIAGNOSIS — Z5321 Procedure and treatment not carried out due to patient leaving prior to being seen by health care provider: Secondary | ICD-10-CM | POA: Insufficient documentation

## 2019-07-22 DIAGNOSIS — R0789 Other chest pain: Secondary | ICD-10-CM | POA: Insufficient documentation

## 2019-07-22 LAB — CBC
HCT: 36 % (ref 36.0–46.0)
Hemoglobin: 11.6 g/dL — ABNORMAL LOW (ref 12.0–15.0)
MCH: 31.4 pg (ref 26.0–34.0)
MCHC: 32.2 g/dL (ref 30.0–36.0)
MCV: 97.6 fL (ref 80.0–100.0)
Platelets: 400 10*3/uL (ref 150–400)
RBC: 3.69 MIL/uL — ABNORMAL LOW (ref 3.87–5.11)
RDW: 12.5 % (ref 11.5–15.5)
WBC: 10.4 10*3/uL (ref 4.0–10.5)
nRBC: 0 % (ref 0.0–0.2)

## 2019-07-22 LAB — BASIC METABOLIC PANEL
Anion gap: 13 (ref 5–15)
BUN: 30 mg/dL — ABNORMAL HIGH (ref 6–20)
CO2: 21 mmol/L — ABNORMAL LOW (ref 22–32)
Calcium: 9.2 mg/dL (ref 8.9–10.3)
Chloride: 102 mmol/L (ref 98–111)
Creatinine, Ser: 1.33 mg/dL — ABNORMAL HIGH (ref 0.44–1.00)
GFR calc Af Amer: 51 mL/min — ABNORMAL LOW (ref >60)
GFR calc non Af Amer: 44 mL/min — ABNORMAL LOW (ref >60)
Glucose, Bld: 90 mg/dL (ref 70–99)
Potassium: 4 mmol/L (ref 3.5–5.1)
Sodium: 136 mmol/L (ref 135–145)

## 2019-07-22 LAB — I-STAT BETA HCG BLOOD, ED (MC, WL, AP ONLY): I-stat hCG, quantitative: <5 m[IU]/mL (ref <5)

## 2019-07-22 LAB — TROPONIN I (HIGH SENSITIVITY): Troponin I (High Sensitivity): 5 ng/L (ref <18)

## 2019-07-22 MED ORDER — SODIUM CHLORIDE 0.9% FLUSH: 3.0000 mL | Freq: Once | INTRAVENOUS | Status: DC

## 2019-07-22 NOTE — ED Triage Notes (Signed)
Pt presents with [facility] for chest and back pain. EMS reports the chest pain subsided prior to their arrival. EMS reports the back pain was still present upon their arrival however pt reports the pain has subsided now. Pt believes she had gas.

## 2019-07-22 NOTE — ED Notes (Signed)
Pt's daughter came in stating that it would be better for the patient to just leave. "She's not hooked up to anything. If she's going to stroke out she can stroke out at home." NT, [place] M., removed pts IV and patient left with daughter.

## 2019-08-09 IMAGING — CT NM PET TUM IMG INITIAL (PI) SKULL BASE T - THIGH
1 of 8 series · 1 of 25 positions shown · non-contrast
Comparison: Chest CT 03/10/2018 and prior study 02/05/2017

CLINICAL DATA: Initial treatment strategy for right upper lobe
pulmonary nodule.

EXAM:
NUCLEAR MEDICINE PET SKULL BASE TO THIGH
TECHNIQUE: 8.6 mCi F-18 FDG was injected intravenously. Full-ring PET imaging
was performed from the skull base to thigh after the radiotracer. CT
data was obtained and used for attenuation correction and anatomic
localization.
Fasting blood glucose: 90 mg/dl

[Series 3: pet sk_thigh ac · axial · 5.0mm · 4.07mm/px · 1 of 223 slices shown]
[im 149/223]
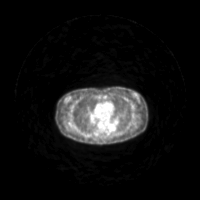

[1 of 25 positions shown; findings below may reference images not displayed]

FINDINGS: Mediastinal blood pool activity: SUV max

NECK: No hypermetabolic lymph nodes in the neck.

Incidental CT findings: none

CHEST: The lobulated 19 x 16 mm right apical lung lesion is weakly
hypermetabolic with SUV max of 2.63. It has clearly enlarged since
prior CT scan from October 2016 and is suspicious for a neoplastic
process. Recommend biopsy.

No enlarged or hypermetabolic mediastinal or hilar lymph nodes.

Stable underlying mild emphysematous changes but no other pulmonary
nodules or new/acute pulmonary findings.

Incidental CT findings: none

ABDOMEN/PELVIS: No abnormal hypermetabolic activity within the
liver, pancreas, adrenal glands, or spleen. No hypermetabolic lymph
nodes in the abdomen or pelvis.

Incidental CT findings: Lower pole left renal calculus and right
renal cyst. Stable advanced atherosclerotic calcifications involving
the aorta and iliac arteries.

SKELETON: No focal hypermetabolic activity to suggest skeletal
metastasis.

Incidental CT findings: none
IMPRESSION: Lobulated 19 x 16 mm right apical lung lesion is weakly
hypermetabolic but suspicious for neoplastic process. Recommend
biopsy.

No enlarged or hypermetabolic mediastinal or hilar lymph nodes or
evidence of metastatic disease elsewhere.

## 2019-08-14 IMAGING — CR DG CHEST 1V PORT
1 series · 1 of 1 positions shown · non-contrast
Comparison: None.

CLINICAL DATA: Follow-up lung biopsy

EXAM:
PORTABLE CHEST 1 VIEW

[AP]
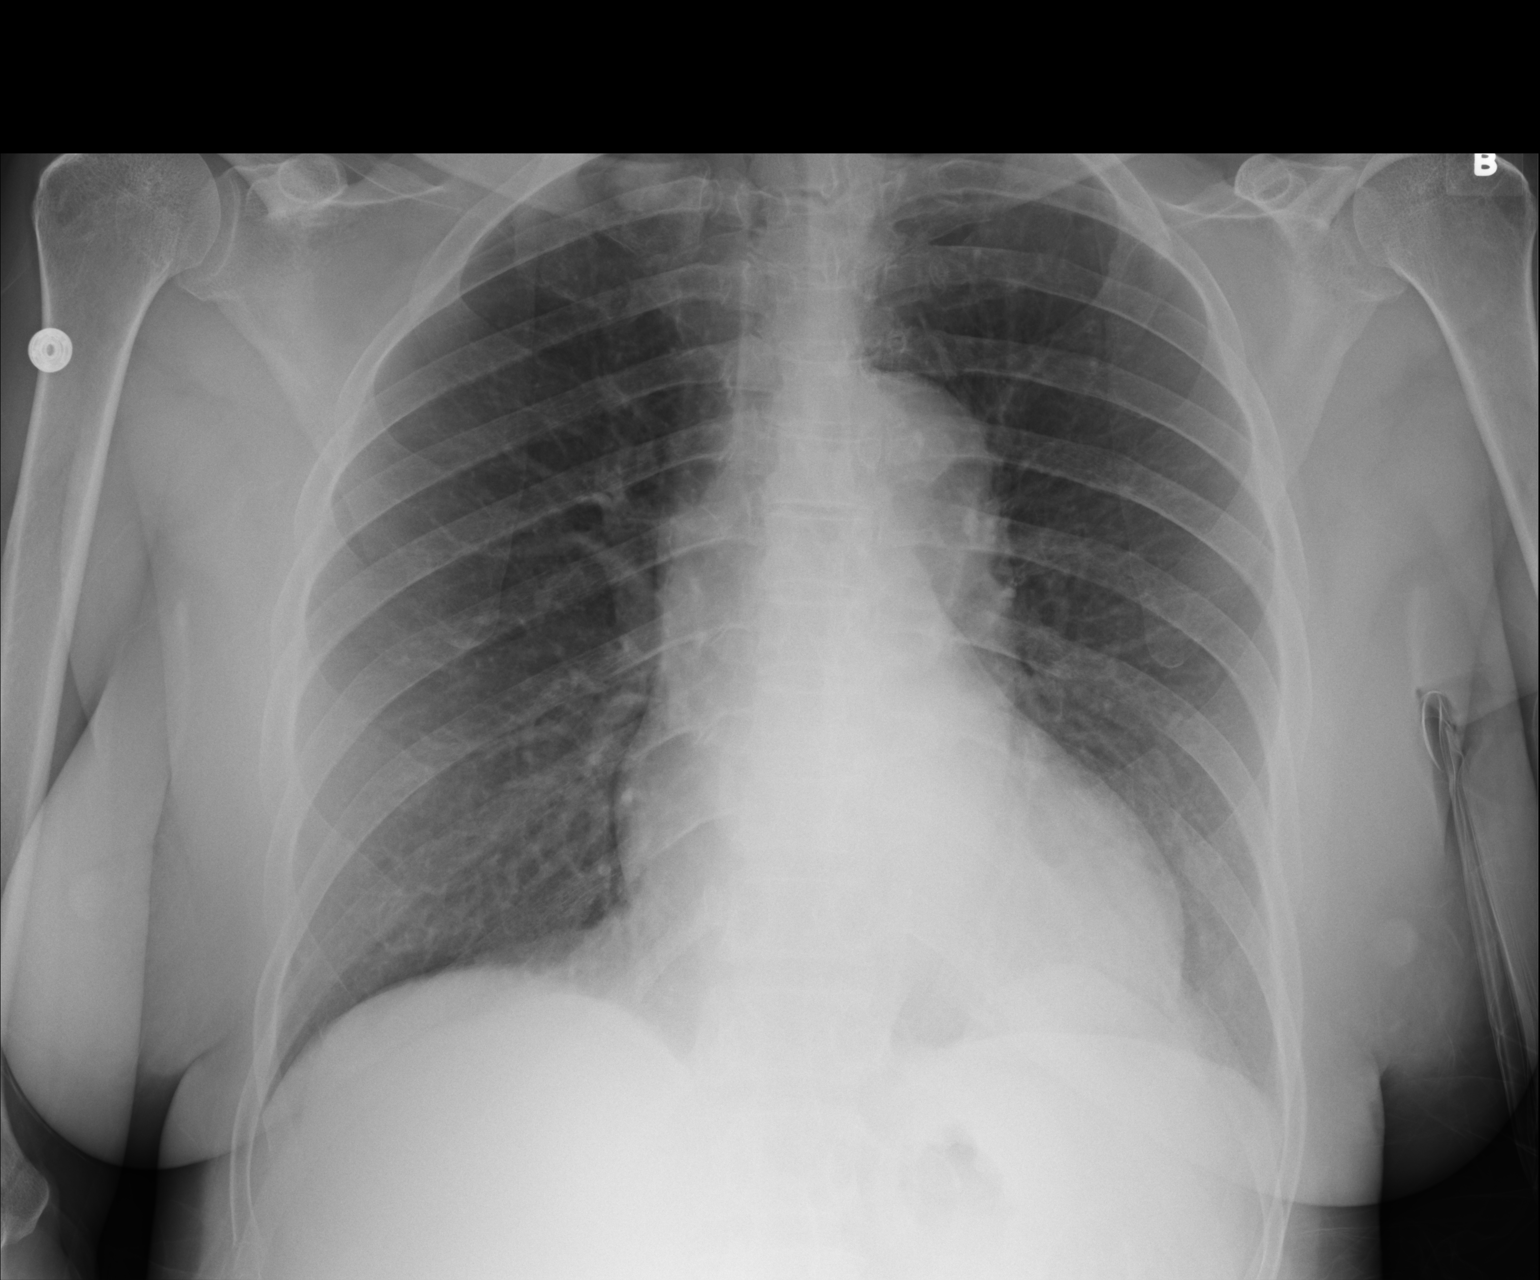

[1 of 1 positions shown; findings below may reference images not displayed]

FINDINGS: Known right apical lung nodule is again seen and stable. Tiny right
apical pneumothorax is noted with 5 mm excursion from the apex. No
other focal abnormality is noted. Cardiac shadow is stable.
IMPRESSION: Tiny pneumothorax on the right following percutaneous lung biopsy.

## 2019-10-01 IMAGING — CR DG CHEST 2V
2 series · 2 of 2 positions shown · non-contrast
Comparison: 03/25/2018

CLINICAL DATA: Right upper lung nodule.

EXAM:
CHEST - 2 VIEW

[w chest pa]
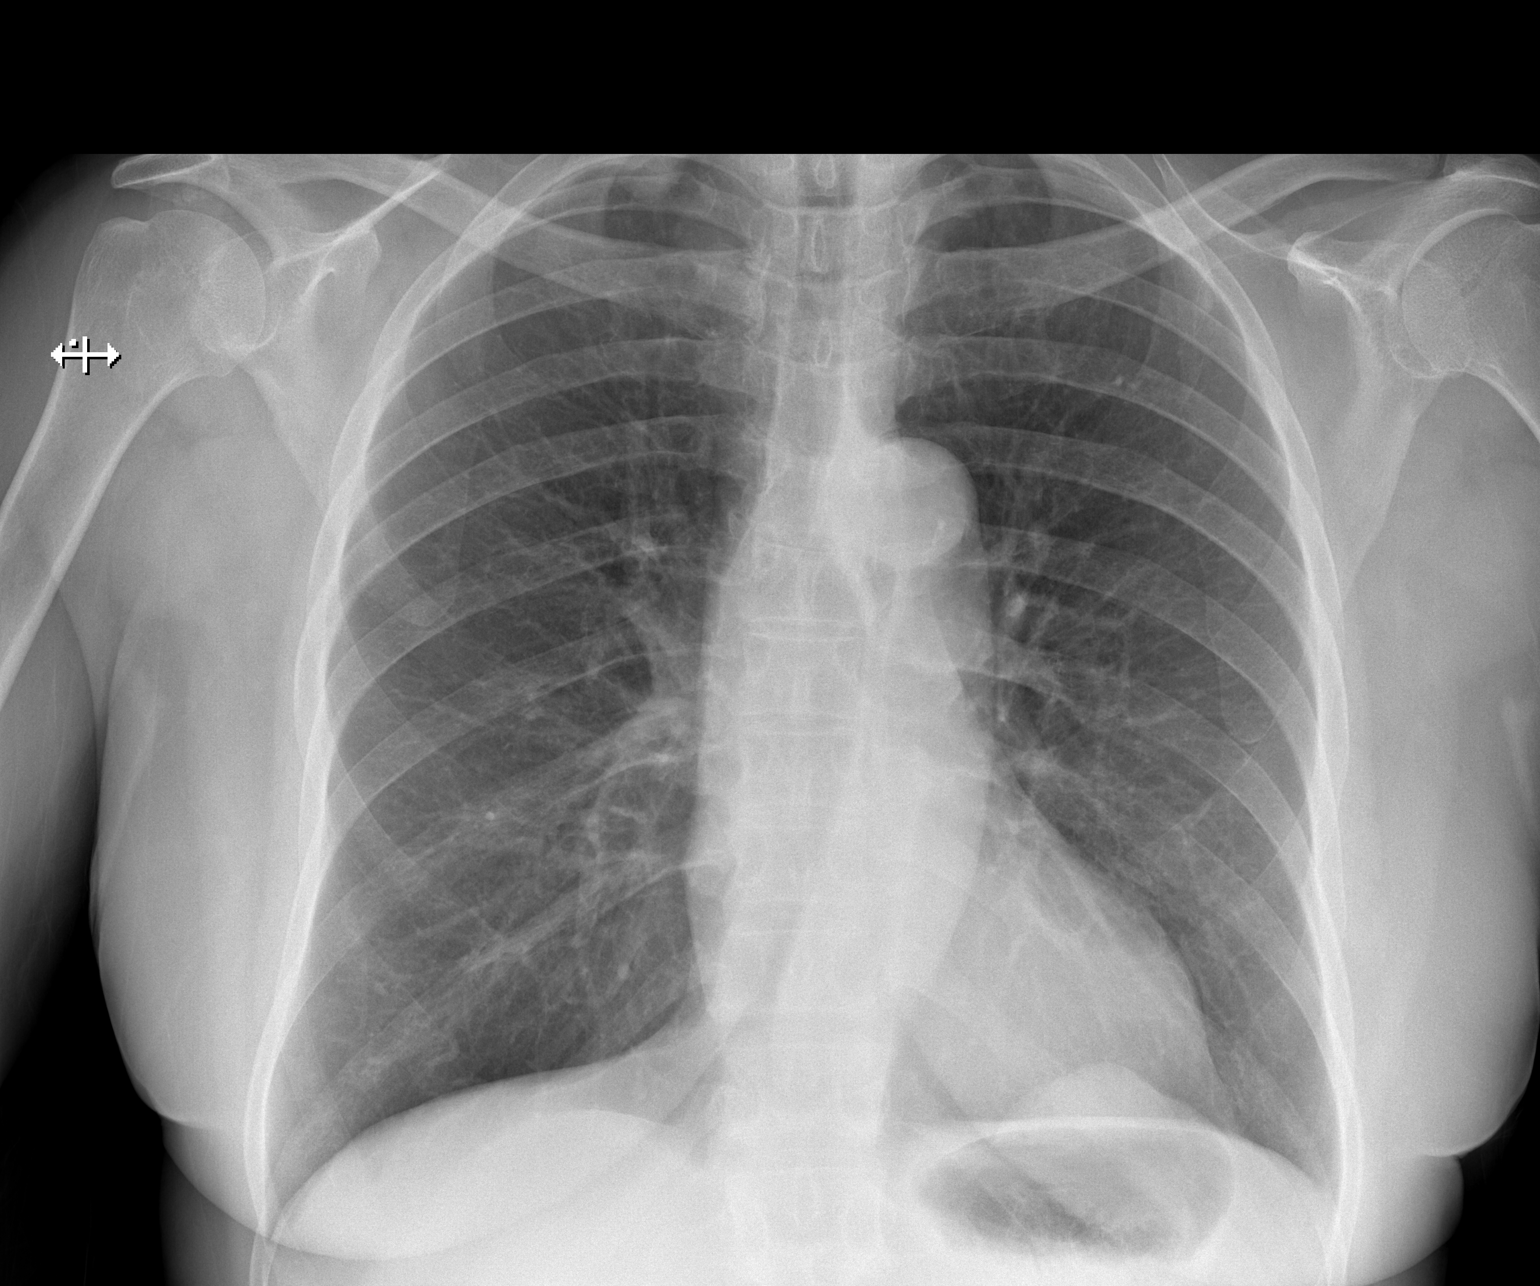

[w chest lat]
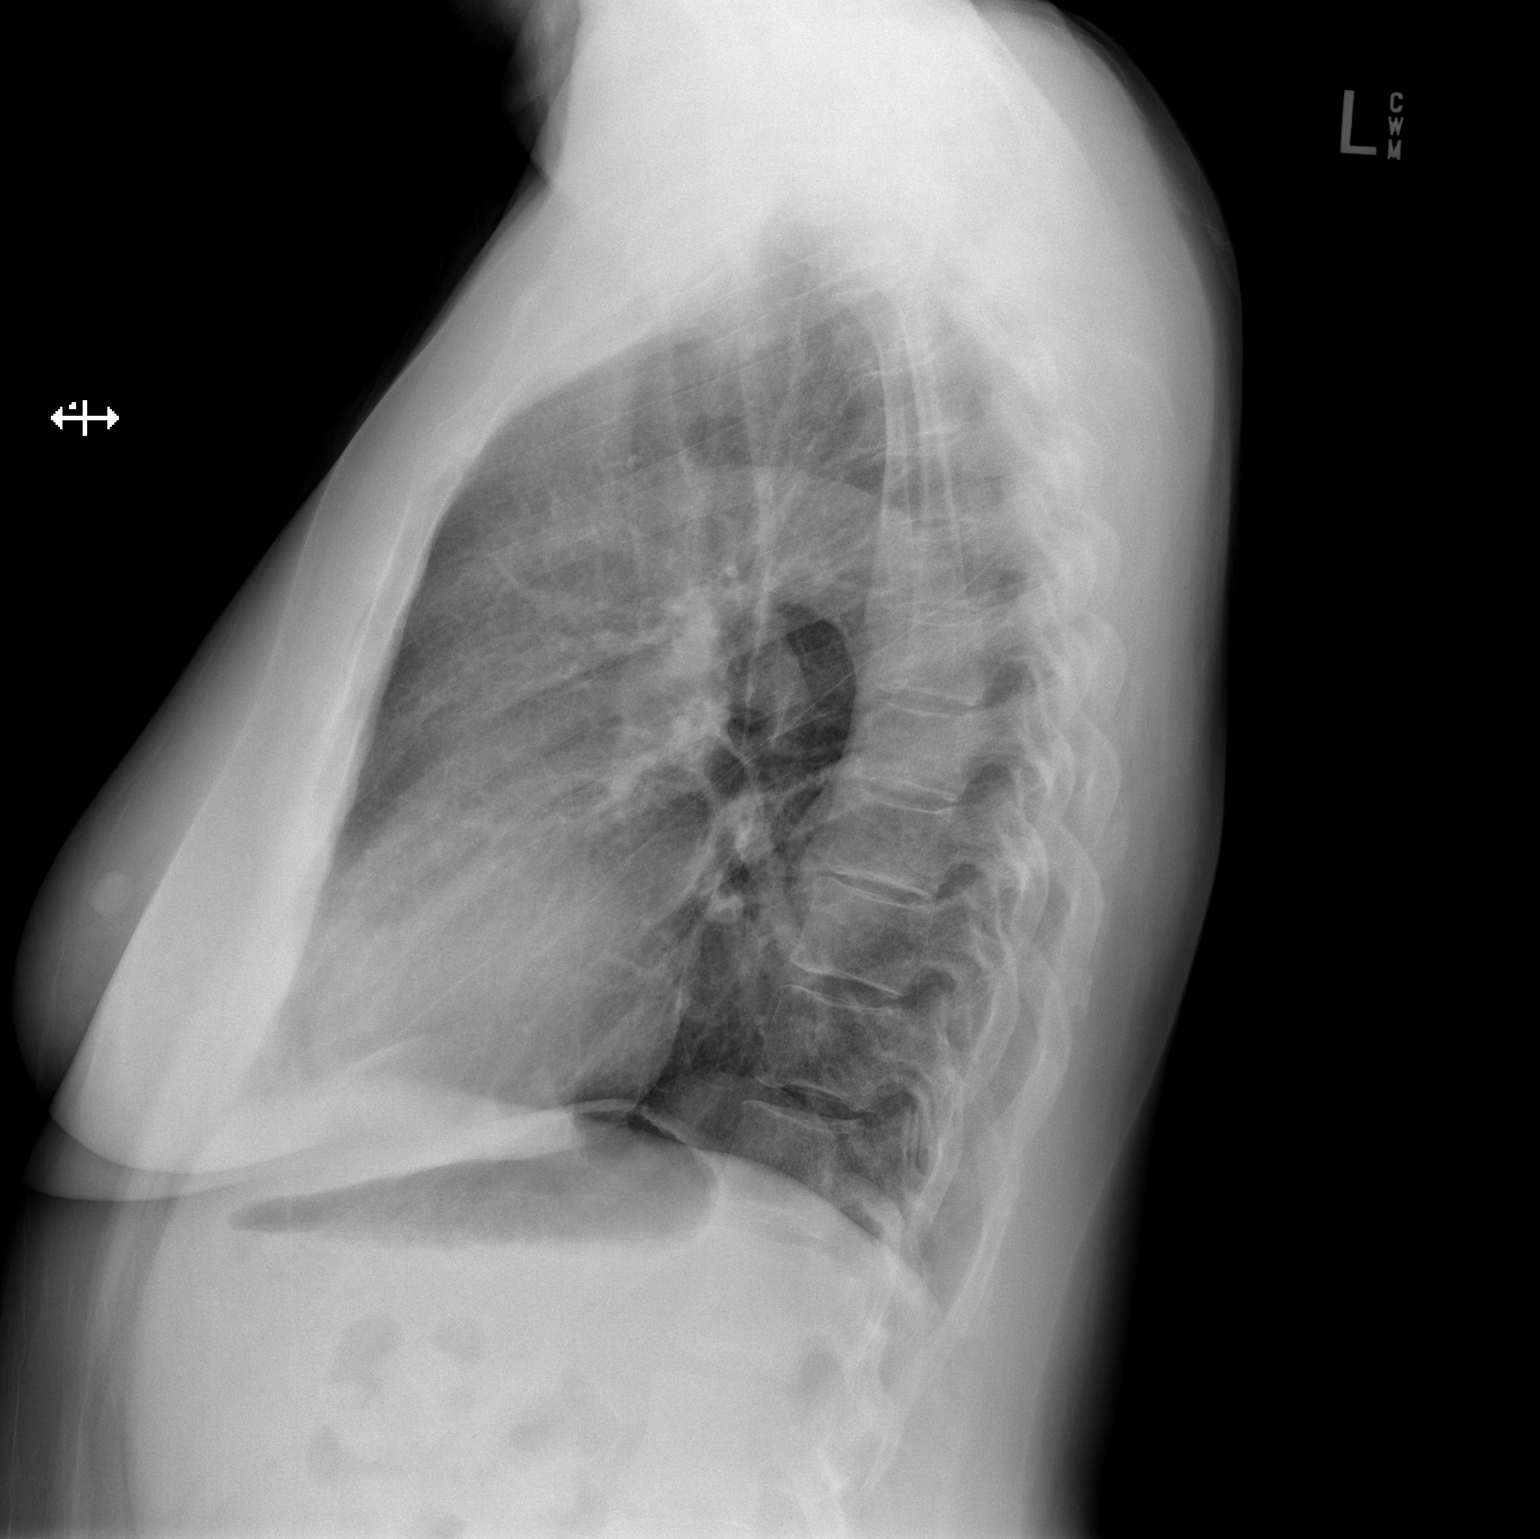

[2 of 2 positions shown; findings below may reference images not displayed]

FINDINGS: No focal consolidation, pleural effusion or pneumothorax. Stable 17
mm right upper lobe pulmonary nodule. Stable cardiomediastinal
silhouette. No aggressive osseous lesion.
IMPRESSION: No active cardiopulmonary disease.

Stable 17 mm right upper lobe pulmonary nodule.
